# Patient Record
Sex: Male | Born: 1965
Health system: Southern US, Community
[De-identification: ages and names within clinical notes are randomized; demographics above are authoritative.]

## PROBLEM LIST (undated history)

## (undated) DIAGNOSIS — E119 Type 2 diabetes mellitus without complications: Secondary | ICD-10-CM

## (undated) DIAGNOSIS — E785 Hyperlipidemia, unspecified: Secondary | ICD-10-CM

## (undated) DIAGNOSIS — I10 Essential (primary) hypertension: Secondary | ICD-10-CM

## (undated) HISTORY — DX: Type 2 diabetes mellitus without complications: E11.9

## (undated) HISTORY — DX: Hyperlipidemia, unspecified: E78.5

## (undated) HISTORY — DX: Essential (primary) hypertension: I10

---

## 2003-02-10 ENCOUNTER — Emergency Department (HOSPITAL_COMMUNITY): Admission: EM | Admit: 2003-02-10 | Discharge: 2003-02-10 | Payer: Self-pay | Admitting: Emergency Medicine

## 2004-12-21 ENCOUNTER — Emergency Department (HOSPITAL_COMMUNITY): Admission: EM | Admit: 2004-12-21 | Discharge: 2004-12-21 | Payer: Self-pay | Admitting: Family Medicine

## 2006-10-27 ENCOUNTER — Emergency Department (HOSPITAL_COMMUNITY): Admission: EM | Admit: 2006-10-27 | Discharge: 2006-10-27 | Payer: Self-pay | Admitting: Family Medicine

## 2006-11-22 ENCOUNTER — Encounter: Admission: RE | Admit: 2006-11-22 | Discharge: 2006-11-22 | Payer: Self-pay | Admitting: Cardiology

## 2010-11-04 LAB — I-STAT 8, (EC8 V) (CONVERTED LAB)
Chloride: 104
Glucose, Bld: 142 — ABNORMAL HIGH
HCT: 51
Hemoglobin: 17.3 — ABNORMAL HIGH
Operator id: 116391
Sodium: 140
TCO2: 31
pH, Ven: 7.376 — ABNORMAL HIGH

## 2010-11-04 LAB — POCT URINALYSIS DIP (DEVICE)
Bilirubin Urine: NEGATIVE
Glucose, UA: NEGATIVE
Ketones, ur: NEGATIVE
Urobilinogen, UA: 0.2

## 2010-11-04 LAB — POCT I-STAT CREATININE: Creatinine, Ser: 1.1

## 2011-02-04 ENCOUNTER — Ambulatory Visit (INDEPENDENT_AMBULATORY_CARE_PROVIDER_SITE_OTHER): Payer: 59

## 2011-02-04 DIAGNOSIS — R05 Cough: Secondary | ICD-10-CM

## 2011-02-04 DIAGNOSIS — J069 Acute upper respiratory infection, unspecified: Secondary | ICD-10-CM

## 2011-02-04 DIAGNOSIS — R059 Cough, unspecified: Secondary | ICD-10-CM

## 2011-03-04 ENCOUNTER — Ambulatory Visit (INDEPENDENT_AMBULATORY_CARE_PROVIDER_SITE_OTHER): Payer: 59 | Admitting: Family Medicine

## 2011-03-04 ENCOUNTER — Ambulatory Visit: Payer: 59

## 2011-03-04 VITALS — BP 165/95 | HR 65 | Temp 98.5°F | Resp 18 | Ht 68.0 in | Wt 192.6 lb

## 2011-03-04 DIAGNOSIS — M25512 Pain in left shoulder: Secondary | ICD-10-CM

## 2011-03-04 DIAGNOSIS — I10 Essential (primary) hypertension: Secondary | ICD-10-CM

## 2011-03-04 DIAGNOSIS — M79609 Pain in unspecified limb: Secondary | ICD-10-CM

## 2011-03-04 LAB — COMPREHENSIVE METABOLIC PANEL
ALT: 36 U/L (ref 0–53)
AST: 24 U/L (ref 0–37)
Albumin: 4.3 g/dL (ref 3.5–5.2)
Alkaline Phosphatase: 97 U/L (ref 39–117)
BUN: 13 mg/dL (ref 6–23)
CO2: 26 mEq/L (ref 19–32)
Calcium: 9.3 mg/dL (ref 8.4–10.5)
Chloride: 103 mEq/L (ref 96–112)
Creat: 0.87 mg/dL (ref 0.50–1.35)
Glucose, Bld: 147 mg/dL — ABNORMAL HIGH (ref 70–99)
Potassium: 4 mEq/L (ref 3.5–5.3)
Sodium: 139 mEq/L (ref 135–145)
Total Bilirubin: 0.6 mg/dL (ref 0.3–1.2)
Total Protein: 7.6 g/dL (ref 6.0–8.3)

## 2011-03-04 LAB — POCT URINALYSIS DIPSTICK
Bilirubin, UA: NEGATIVE
Blood, UA: NEGATIVE
Glucose, UA: NEGATIVE
Ketones, UA: NEGATIVE
Leukocytes, UA: NEGATIVE
Nitrite, UA: NEGATIVE
Protein, UA: 30
Spec Grav, UA: 1.015
Urobilinogen, UA: 0.2
pH, UA: 7.5

## 2011-03-04 LAB — POCT CBC
Granulocyte percent: 55 %G (ref 37–80)
HCT, POC: 42.4 % — AB (ref 43.5–53.7)
Hemoglobin: 13.6 g/dL — AB (ref 14.1–18.1)
Lymph, poc: 2.4 (ref 0.6–3.4)
MCH, POC: 28.6 pg (ref 27–31.2)
MCHC: 32.1 g/dL (ref 31.8–35.4)
MCV: 89.2 fL (ref 80–97)
MID (cbc): 1.1 — AB (ref 0–0.9)
MPV: 9.7 fL (ref 0–99.8)
POC Granulocyte: 4.2 (ref 2–6.9)
POC LYMPH PERCENT: 31.2 %L (ref 10–50)
POC MID %: 13.8 %M — AB (ref 0–12)
Platelet Count, POC: 189 10*3/uL (ref 142–424)
RBC: 4.75 M/uL (ref 4.69–6.13)
RDW, POC: 13.6 %
WBC: 7.7 10*3/uL (ref 4.6–10.2)

## 2011-03-04 MED ORDER — MELOXICAM 7.5 MG PO TABS: 7.5000 mg | ORAL_TABLET | Freq: Every day | ORAL | Status: AC

## 2011-03-04 MED ORDER — AMLODIPINE BESYLATE 5 MG PO TABS: 5.0000 mg | ORAL_TABLET | Freq: Every day | ORAL | Status: DC

## 2011-03-04 NOTE — Progress Notes (Signed)
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  He comes in for BP check and pain in left upper arm for 2 days.  His BP was elevated in mid January.   NKI No prior arm pain. Left arm hurts from shoulder to the elbow, fluctuating, worse today No chest pain, SOB, nausea, fever, diaphoresis. Rx:  Tylenol-no help  O:  NAD HEENT:  Negative Skin: warm and dry Neck:  Supple, nontender, no adenop Axillae:  No adenop, nontender Arm:  Nl skin, ROM, muscle contour Chest:  Clear Heart:  Reg, no murmur Abdomen:  Soft, nontender Results for orders placed in visit on 03/04/11  POCT CBC      Component Value Range   WBC 7.7  4.6 - 10.2 (K/uL)   Lymph, poc 2.4  0.6 - 3.4    POC LYMPH PERCENT 31.2  10 - 50 (%L)   MID (cbc) 1.1 (*) 0 - 0.9    POC MID % 13.8 (*) 0 - 12 (%M)   POC Granulocyte 4.2  2 - 6.9    Granulocyte percent 55.0  37 - 80 (%G)   RBC 4.75  4.69 - 6.13 (M/uL)   Hemoglobin 13.6 (*) 14.1 - 18.1 (g/dL)   HCT, POC 21.3 (*) 08.6 - 53.7 (%)   MCV 89.2  80 - 97 (fL)   MCH, POC 28.6  27 - 31.2 (pg)   MCHC 32.1  31.8 - 35.4 (g/dL)   RDW, POC 57.8     Platelet Count, POC 189  142 - 424 (K/uL)   MPV 9.7  0 - 99.8 (fL)  POCT URINALYSIS DIPSTICK      Component Value Range   Color, UA yellow     Clarity, UA clear     Glucose, UA neg     Bilirubin, UA neg     Ketones, UA neg     Spec Grav, UA 1.015     Blood, UA neg     pH, UA 7.5     Protein, UA 30     Urobilinogen, UA 0.2     Nitrite, UA neg     Leukocytes, UA Negative    EKG:  [facility] reading (PRIMARY) by  Dr. Milus Glazier negative.   A:  Hypertension, Atypical left arm pain

## 2011-03-04 NOTE — Patient Instructions (Signed)
Return in 1 month for blood pressure check and review of the arm pain   Hypertension As your heart beats, it forces blood through your arteries. This force is your blood pressure. If the pressure is too high, it is called hypertension (HTN) or high blood pressure. HTN is dangerous because you may have it and not know it. High blood pressure may mean that your heart has to work harder to pump blood. Your arteries may be narrow or stiff. The extra work puts you at risk for heart disease, stroke, and other problems.  Blood pressure consists of two numbers, a higher number over a lower, 110/72, for example. It is stated as "110 over 72." The ideal is below 120 for the top number (systolic) and under 80 for the bottom (diastolic). Write down your blood pressure today. You should pay close attention to your blood pressure if you have certain conditions such as:  Heart failure.   Prior heart attack.   Diabetes   Chronic kidney disease.   Prior stroke.   Multiple risk factors for heart disease.  To see if you have HTN, your blood pressure should be measured while you are seated with your arm held at the level of the heart. It should be measured at least twice. A one-time elevated blood pressure reading (especially in the Emergency Department) does not mean that you need treatment. There may be conditions in which the blood pressure is different between your right and left arms. It is important to see your caregiver soon for a recheck. Most people have essential hypertension which means that there is not a specific cause. This type of high blood pressure may be lowered by changing lifestyle factors such as:  Stress.   Smoking.   Lack of exercise.   Excessive weight.   Drug/tobacco/alcohol use.   Eating less salt.  Most people do not have symptoms from high blood pressure until it has caused damage to the body. Effective treatment can often prevent, delay or reduce that damage. TREATMENT    When a cause has been identified, treatment for high blood pressure is directed at the cause. There are a large number of medications to treat HTN. These fall into several categories, and your caregiver will help you select the medicines that are best for you. Medications may have side effects. You should review side effects with your caregiver. If your blood pressure stays high after you have made lifestyle changes or started on medicines,   Your medication(s) may need to be changed.   Other problems may need to be addressed.   Be certain you understand your prescriptions, and know how and when to take your medicine.   Be sure to follow up with your caregiver within the time frame advised (usually within two weeks) to have your blood pressure rechecked and to review your medications.   If you are taking more than one medicine to lower your blood pressure, make sure you know how and at what times they should be taken. Taking two medicines at the same time can result in blood pressure that is too low.  SEEK IMMEDIATE MEDICAL CARE IF:  You develop a severe headache, blurred or changing vision, or confusion.   You have unusual weakness or numbness, or a faint feeling.   You have severe chest or abdominal pain, vomiting, or breathing problems.  MAKE SURE YOU:   Understand these instructions.   Will watch your condition.   Will get help right away if  you are not doing well or get worse.  Document Released: 01/10/2005 Document Revised: 09/22/2010 Document Reviewed: 08/31/2007 [facility] Patient Information 2012 [place], [place].

## 2012-03-07 ENCOUNTER — Ambulatory Visit (INDEPENDENT_AMBULATORY_CARE_PROVIDER_SITE_OTHER): Payer: 59 | Admitting: Family Medicine

## 2012-03-07 VITALS — BP 144/91 | HR 65 | Temp 98.3°F | Resp 18 | Wt 192.0 lb

## 2012-03-07 DIAGNOSIS — I10 Essential (primary) hypertension: Secondary | ICD-10-CM

## 2012-03-07 DIAGNOSIS — Z0130 Encounter for examination of blood pressure without abnormal findings: Secondary | ICD-10-CM

## 2012-03-07 LAB — POCT CBC
Hemoglobin: 13.6 g/dL — AB (ref 14.1–18.1)
Lymph, poc: 2.3 (ref 0.6–3.4)
MCH, POC: 28.6 pg (ref 27–31.2)
MCHC: 31.6 g/dL — AB (ref 31.8–35.4)
POC Granulocyte: 3.4 (ref 2–6.9)
POC MID %: 12.1 %M — AB (ref 0–12)
Platelet Count, POC: 209 10*3/uL (ref 142–424)
WBC: 6.4 10*3/uL (ref 4.6–10.2)

## 2012-03-07 LAB — POCT URINALYSIS DIPSTICK
Blood, UA: NEGATIVE
Spec Grav, UA: 1.025
Urobilinogen, UA: 0.2

## 2012-03-07 MED ORDER — AMLODIPINE BESYLATE 10 MG PO TABS: 10.0000 mg | ORAL_TABLET | Freq: Every day | ORAL | Status: DC

## 2012-03-07 MED ORDER — METFORMIN HCL 500 MG PO TABS: 500.0000 mg | ORAL_TABLET | Freq: Two times a day (BID) | ORAL | Status: DC

## 2012-03-07 NOTE — Patient Instructions (Addendum)
1. Blood pressure check  POCT CBC   POCT CBC   POCT urinalysis dipstick   POCT glycosylated hemoglobin (Hb A1C)   TSH   Comprehensive metabolic panel   Lipid panel  2. Essential hypertension, benign  amLODipine (NORVASC) 10 MG tablet   amLODipine (NORVASC) 10 MG tablet  3. Type II or unspecified type diabetes mellitus without mention of complication, uncontrolled  metFORMIN (GLUCOPHAGE) 500 MG tablet   metFORMIN (GLUCOPHAGE) 500 MG tablet

## 2012-03-07 NOTE — Progress Notes (Signed)
[address]   [place], [place]  [address]   [phone]  Subjective:    Patient ID: Chad Jacobs, male    DOB: 1966-01-20, 47 y.o.   MRN: [id number]  HPI This 47 y.o. male presents for evaluation of HTN.  Last visit one year ago.  Started on Amlodipine 5mg  daily.  Checks BP at pharmacy intermittently; no recent readings.  No chest pain, palpitations, shortness of breath, leg swelling.  Heat in feet.  Intermittent HA; no dizziness; no blurred vision.  Reports good compliance with medication; good tolerance to medication; good symptom control.   2.  DM II: Blood sugar of 146 at visit last year; did not return for repeat labs.  B:  eggs, juice or water.  Snack: none  Lunch:  Beef, chicken, rice, water.  Snack:  Chips.  Supper: beef, chicken, vegetables, water, apples. Has gained weight in stomach.  Feels well. No family history of DMII.   Review of Systems  Constitutional: Negative for fever, chills, diaphoresis and fatigue.  Respiratory: Negative for shortness of breath and wheezing.   Cardiovascular: Negative for chest pain, palpitations and leg swelling.  Neurological: Negative for dizziness, tremors, seizures, syncope, facial asymmetry, speech difficulty, weakness, light-headedness, numbness and headaches.        Past Medical History  Diagnosis Date  . Hypertension     History reviewed. No pertinent past surgical history.  Prior to Admission medications   Medication Sig Start Date End Date Taking? Authorizing Provider  amLODipine (NORVASC) 5 MG tablet Take 1 tablet (5 mg total) by mouth daily. 03/04/11 03/03/12  Elvina Sidle, MD    No Known Allergies  History   Social History  . Marital Status: Married    Spouse Name: N/A    Number of Children: N/A  . Years of Education: N/A   Occupational History  . Not on file.   Social History Main Topics  . Smoking status: Never Smoker   . Smokeless tobacco: Never Used  . Alcohol Use: No  . Drug Use: No  . Sexually Active: Not  on file   Other Topics Concern  . Not on file   Social History Narrative   Marital status: single; not dating.  Moved to [place] from [place] in 2004.      Children:  2 children; no grandchildren.      Lives: with brother.      Employment: works for [employer]; Naval architect work; [employer] on trucks.      Tobacco: none      Alcohol: none      Drugs: none          No family history on file.  Objective:   Physical Exam  Nursing note and vitals reviewed. Constitutional: He is oriented to person, place, and time. He appears well-developed and well-nourished. No distress.  HENT:  Mouth/Throat: Oropharynx is clear and moist.  Poor dentition.  Eyes: Conjunctivae and EOM are normal. Pupils are equal, round, and reactive to light.  Neck: Normal range of motion. Neck supple. No JVD present. No thyromegaly present.  Cardiovascular: Normal rate, regular rhythm and normal heart sounds.  Exam reveals no gallop and no friction rub.   No murmur heard. Pulmonary/Chest: Effort normal and breath sounds normal. No respiratory distress. He has no wheezes. He has no rales.  Abdominal: Soft. Bowel sounds are normal. He exhibits no distension. There is no tenderness. There is no rebound and no guarding.  Lymphadenopathy:    He has no  cervical adenopathy.  Neurological: He is alert and oriented to person, place, and time. No cranial nerve deficit. He exhibits normal muscle tone. Coordination normal.  Skin: He is not diaphoretic.  Psychiatric: He has a normal mood and affect. His behavior is normal.   Results for orders placed in visit on 03/07/12  POCT CBC      Result Value Range   WBC 6.4  4.6 - 10.2 K/uL   Lymph, poc 2.3  0.6 - 3.4   POC LYMPH PERCENT 35.4  10 - 50 %L   MID (cbc) 0.8  0 - 0.9   POC MID % 12.1 (*) 0 - 12 %M   POC Granulocyte 3.4  2 - 6.9   Granulocyte percent 52.5  37 - 80 %G   RBC 4.75  4.69 - 6.13 M/uL   Hemoglobin 13.6 (*) 14.1 - 18.1 g/dL   HCT, POC 16.1 (*) 09.6 - 53.7 %    MCV 90.8  80 - 97 fL   MCH, POC 28.6  27 - 31.2 pg   MCHC 31.6 (*) 31.8 - 35.4 g/dL   RDW, POC 04.5     Platelet Count, POC 209  142 - 424 K/uL   MPV 9.6  0 - 99.8 fL  POCT URINALYSIS DIPSTICK      Result Value Range   Color, UA yellow     Clarity, UA clear     Glucose, UA neg     Bilirubin, UA neg     Ketones, UA neg     Spec Grav, UA 1.025     Blood, UA neg     pH, UA 6.0     Protein, UA trace     Urobilinogen, UA 0.2     Nitrite, UA neg     Leukocytes, UA Negative    POCT GLYCOSYLATED HEMOGLOBIN (HGB A1C)      Result Value Range   Hemoglobin A1C 7.4         Assessment & Plan:  Blood pressure check - Plan: POCT CBC, POCT urinalysis dipstick, POCT glycosylated hemoglobin (Hb A1C), TSH, Comprehensive metabolic panel, Lipid panel  Essential hypertension, benign - Plan: amLODipine (NORVASC) 10 MG tablet  Type II or unspecified type diabetes mellitus without mention of complication, uncontrolled - Plan: metFORMIN (GLUCOPHAGE) 500 MG tablet, Ambulatory referral to diabetic education    1. HTN:  Improved; increase Amlodipine to 10mg  daily.  Will warrant ACE in future with new diagnosis of DMII but hesitate to add two new medications at visit. Obtain labs. 2.  DMII: new diagnosis.  Counseled extensively during visit; recommend weight loss, exercise, low-sugar and low-carbohydrate food intake.  Rx for Metformin provided to start bid.  Refer for diabetic education.    Meds ordered this encounter  Medications  . amLODipine (NORVASC) 10 MG tablet    Sig: Take 1 tablet (10 mg total) by mouth daily.    Dispense:  90 tablet    Refill:  1  . metFORMIN (GLUCOPHAGE) 500 MG tablet    Sig: Take 1 tablet (500 mg total) by mouth 2 (two) times daily with a meal.    Dispense:  60 tablet    Refill:  5

## 2012-03-09 LAB — LIPID PANEL
Cholesterol: 228 mg/dL — ABNORMAL HIGH (ref 0–200)
LDL Cholesterol: 155 mg/dL — ABNORMAL HIGH (ref 0–99)
Total CHOL/HDL Ratio: 5.2 Ratio
VLDL: 29 mg/dL (ref 0–40)

## 2012-03-09 LAB — COMPREHENSIVE METABOLIC PANEL
AST: 21 U/L (ref 0–37)
BUN: 17 mg/dL (ref 6–23)
CO2: 27 mEq/L (ref 19–32)
Calcium: 8.9 mg/dL (ref 8.4–10.5)
Glucose, Bld: 173 mg/dL — ABNORMAL HIGH (ref 70–99)
Potassium: 4 mEq/L (ref 3.5–5.3)

## 2012-03-09 LAB — TSH: TSH: 1.031 u[IU]/mL (ref 0.350–4.500)

## 2012-03-13 NOTE — Progress Notes (Signed)
Called pt to schedule 3 month follow up with Dr. Katrinka Blazing, only # given = no voicemail has been set up.

## 2012-03-14 NOTE — Progress Notes (Signed)
Reminder letter sent to pt to schedule 3 month f-up with Dr. Katrinka Blazing.

## 2012-06-04 ENCOUNTER — Encounter: Payer: Self-pay | Admitting: Family Medicine

## 2012-06-04 ENCOUNTER — Ambulatory Visit (INDEPENDENT_AMBULATORY_CARE_PROVIDER_SITE_OTHER): Payer: 59 | Admitting: Family Medicine

## 2012-06-04 VITALS — BP 142/98 | HR 73 | Temp 98.9°F | Resp 16 | Ht 68.0 in | Wt 189.2 lb

## 2012-06-04 DIAGNOSIS — I10 Essential (primary) hypertension: Secondary | ICD-10-CM

## 2012-06-04 DIAGNOSIS — E119 Type 2 diabetes mellitus without complications: Secondary | ICD-10-CM

## 2012-06-04 DIAGNOSIS — E7800 Pure hypercholesterolemia, unspecified: Secondary | ICD-10-CM

## 2012-06-04 DIAGNOSIS — IMO0001 Reserved for inherently not codable concepts without codable children: Secondary | ICD-10-CM

## 2012-06-04 LAB — CBC WITH DIFFERENTIAL/PLATELET
Basophils Relative: 0 % (ref 0–1)
Eosinophils Absolute: 0.9 10*3/uL — ABNORMAL HIGH (ref 0.0–0.7)
Lymphocytes Relative: 24 % (ref 12–46)
Lymphs Abs: 1.9 10*3/uL (ref 0.7–4.0)
MCHC: 34 g/dL (ref 30.0–36.0)
MCV: 86.7 fL (ref 78.0–100.0)
RDW: 13.6 % (ref 11.5–15.5)
WBC: 7.8 10*3/uL (ref 4.0–10.5)

## 2012-06-04 LAB — COMPREHENSIVE METABOLIC PANEL
Albumin: 4.1 g/dL (ref 3.5–5.2)
Alkaline Phosphatase: 87 U/L (ref 39–117)
BUN: 21 mg/dL (ref 6–23)
CO2: 20 mEq/L (ref 19–32)
Chloride: 105 mEq/L (ref 96–112)
Creat: 0.87 mg/dL (ref 0.50–1.35)
Potassium: 4.1 mEq/L (ref 3.5–5.3)

## 2012-06-04 LAB — POCT GLYCOSYLATED HEMOGLOBIN (HGB A1C): Hemoglobin A1C: 6.9

## 2012-06-04 MED ORDER — METFORMIN HCL 500 MG PO TABS: 500.0000 mg | ORAL_TABLET | Freq: Two times a day (BID) | ORAL | Status: DC

## 2012-06-04 MED ORDER — AMLODIPINE BESYLATE 10 MG PO TABS: 10.0000 mg | ORAL_TABLET | Freq: Every day | ORAL | Status: DC

## 2012-06-04 NOTE — Progress Notes (Signed)
[address]   [place], [place]  [address]   [phone]  Subjective:    Patient ID: Chad Jacobs, male    DOB: 1965/04/11, 47 y.o.   MRN: [id number]  HPI This 47 y.o. male presents for three month follow-up:  1. HTN: increased Amlodipine to 10mg  daily at last visit; reports compliance with medication; good tolerance to medication; good symptom control.  Denies chest pain, palpitations, SOB, leg swelling. Denies HA, dizziness, focal weakness, paresthesias.  2.  DMII: new diagnosis at last visit; did not go for diabetic nutrition education; not checking sugars; reports compliance with medication/Metformin; no side effects.    3. Hyperlipidemia:  Trying to lose weight.     Review of Systems  Constitutional: Negative for fever, chills, diaphoresis and fatigue.  Respiratory: Negative for shortness of breath, wheezing and stridor.   Cardiovascular: Negative for chest pain, palpitations and leg swelling.  Endocrine: Negative for cold intolerance, heat intolerance, polydipsia, polyphagia and polyuria.  Skin: Negative for color change, pallor, rash and wound.  Neurological: Negative for dizziness, tremors, seizures, syncope, facial asymmetry, speech difficulty, weakness, light-headedness, numbness and headaches.    Past Medical History  Diagnosis Date  . Hypertension     History reviewed. No pertinent past surgical history.  Prior to Admission medications   Medication Sig Start Date End Date Taking? Authorizing Provider  amLODipine (NORVASC) 10 MG tablet Take 1 tablet (10 mg total) by mouth daily. 06/04/12  Yes Ethelda Chick, MD  metFORMIN (GLUCOPHAGE) 500 MG tablet Take 1 tablet (500 mg total) by mouth 2 (two) times daily with a meal. 06/04/12  Yes Ethelda Chick, MD    No Known Allergies  History   Social History  . Marital Status: Married    Spouse Name: N/A    Number of Children: N/A  . Years of Education: N/A   Occupational History  . Not on file.   Social History Main  Topics  . Smoking status: Never Smoker   . Smokeless tobacco: Never Used  . Alcohol Use: No  . Drug Use: No  . Sexually Active: Not on file   Other Topics Concern  . Not on file   Social History Narrative   Marital status: single; not dating.  Moved to [place] from [place] in 2004.      Children:  2 children; no grandchildren.      Lives: with brother.      Employment: works for [employer]; Naval architect work; [employer] on trucks.      Tobacco: none      Alcohol: none      Drugs: none          History reviewed. No pertinent family history.     Objective:   Physical Exam  Nursing note and vitals reviewed. Constitutional: He is oriented to person, place, and time. He appears well-developed and well-nourished. No distress.  HENT:  Head: Normocephalic and atraumatic.  Mouth/Throat: Oropharynx is clear and moist.  Eyes: Conjunctivae and EOM are normal. Pupils are equal, round, and reactive to light.  Neck: Normal range of motion. Neck supple. No JVD present. No thyromegaly present.  Cardiovascular: Normal rate, regular rhythm, normal heart sounds and intact distal pulses.  Exam reveals no gallop and no friction rub.   No murmur heard. Pulmonary/Chest: Effort normal and breath sounds normal. He has no wheezes. He has no rales.  Abdominal: Soft. Bowel sounds are normal. There is no tenderness. There is no rebound and no guarding.  Lymphadenopathy:    He has no cervical adenopathy.  Neurological: He is alert and oriented to person, place, and time. No cranial nerve deficit. He exhibits normal muscle tone. Coordination normal.  Skin: Skin is warm and dry. No rash noted. He is not diaphoretic.  Psychiatric: He has a normal mood and affect. His behavior is normal.        Assessment & Plan:  Type II or unspecified type diabetes mellitus without mention of complication, not stated as uncontrolled - Plan: CBC with Differential, POCT glycosylated hemoglobin (Hb A1C), Comprehensive metabolic  panel  Essential hypertension, benign - Plan: Comprehensive metabolic panel, amLODipine (NORVASC) 10 MG tablet, DISCONTINUED: amLODipine (NORVASC) 10 MG tablet, DISCONTINUED: amLODipine (NORVASC) 10 MG tablet  Type II or unspecified type diabetes mellitus without mention of complication, uncontrolled - Plan: metFORMIN (GLUCOPHAGE) 500 MG tablet, DISCONTINUED: metFORMIN (GLUCOPHAGE) 500 MG tablet, DISCONTINUED: metFORMIN (GLUCOPHAGE) 500 MG tablet   1. HTN: improved; continue Amlodipine 10mg  daily.  Obtain labs. 2.  DMII: stable; non-compliance with nutrition consultation; continue Metformin 500mg  bid; counseling regarding low-sugar and low-carbohydrate food choices reviewed during visit again. 3. Hyperlipidemia: New.  Recommend weight loss, exercise, low-cholesterol and low-fat food choices.   Meds ordered this encounter  Medications  . DISCONTD: metFORMIN (GLUCOPHAGE) 500 MG tablet    Sig: Take 1 tablet (500 mg total) by mouth 2 (two) times daily with a meal.    Dispense:  180 tablet    Refill:  1  . DISCONTD: amLODipine (NORVASC) 10 MG tablet    Sig: Take 1 tablet (10 mg total) by mouth daily.    Dispense:  90 tablet    Refill:  1  . DISCONTD: metFORMIN (GLUCOPHAGE) 500 MG tablet    Sig: Take 1 tablet (500 mg total) by mouth 2 (two) times daily with a meal.    Dispense:  180 tablet    Refill:  1  . DISCONTD: amLODipine (NORVASC) 10 MG tablet    Sig: Take 1 tablet (10 mg total) by mouth daily.    Dispense:  90 tablet    Refill:  1  . metFORMIN (GLUCOPHAGE) 500 MG tablet    Sig: Take 1 tablet (500 mg total) by mouth 2 (two) times daily with a meal.    Dispense:  180 tablet    Refill:  1  . amLODipine (NORVASC) 10 MG tablet    Sig: Take 1 tablet (10 mg total) by mouth daily.    Dispense:  90 tablet    Refill:  1

## 2012-10-20 ENCOUNTER — Ambulatory Visit (INDEPENDENT_AMBULATORY_CARE_PROVIDER_SITE_OTHER): Payer: 59 | Admitting: Family Medicine

## 2012-10-20 VITALS — BP 130/90 | HR 53 | Temp 98.8°F | Resp 16 | Ht 69.5 in | Wt 186.4 lb

## 2012-10-20 DIAGNOSIS — M79602 Pain in left arm: Secondary | ICD-10-CM

## 2012-10-20 DIAGNOSIS — I10 Essential (primary) hypertension: Secondary | ICD-10-CM

## 2012-10-20 DIAGNOSIS — M79609 Pain in unspecified limb: Secondary | ICD-10-CM

## 2012-10-20 DIAGNOSIS — E119 Type 2 diabetes mellitus without complications: Secondary | ICD-10-CM

## 2012-10-20 DIAGNOSIS — IMO0001 Reserved for inherently not codable concepts without codable children: Secondary | ICD-10-CM

## 2012-10-20 LAB — COMPREHENSIVE METABOLIC PANEL
ALT: 21 U/L (ref 0–53)
AST: 16 U/L (ref 0–37)
Albumin: 4 g/dL (ref 3.5–5.2)
Alkaline Phosphatase: 99 U/L (ref 39–117)
BUN: 16 mg/dL (ref 6–23)
CO2: 27 mEq/L (ref 19–32)
Calcium: 9.2 mg/dL (ref 8.4–10.5)
Chloride: 103 mEq/L (ref 96–112)
Creat: 0.9 mg/dL (ref 0.50–1.35)
Glucose, Bld: 133 mg/dL — ABNORMAL HIGH (ref 70–99)
Potassium: 3.9 mEq/L (ref 3.5–5.3)
Sodium: 135 mEq/L (ref 135–145)
Total Bilirubin: 0.3 mg/dL (ref 0.3–1.2)
Total Protein: 7.3 g/dL (ref 6.0–8.3)

## 2012-10-20 LAB — MICROALBUMIN, URINE: Microalb, Ur: 0.5 mg/dL (ref 0.00–1.89)

## 2012-10-20 LAB — POCT CBC
Granulocyte percent: 60 %G (ref 37–80)
HCT, POC: 41.9 % — AB (ref 43.5–53.7)
Hemoglobin: 13.2 g/dL — AB (ref 14.1–18.1)
Lymph, poc: 2.2 (ref 0.6–3.4)
MCH, POC: 29.4 pg (ref 27–31.2)
MCHC: 31.5 g/dL — AB (ref 31.8–35.4)
MCV: 93.4 fL (ref 80–97)
MID (cbc): 0.7 (ref 0–0.9)
MPV: 9.4 fL (ref 0–99.8)
POC Granulocyte: 4.3 (ref 2–6.9)
POC LYMPH PERCENT: 30.6 %L (ref 10–50)
POC MID %: 9.4 %M (ref 0–12)
Platelet Count, POC: 196 10*3/uL (ref 142–424)
RBC: 4.49 M/uL — AB (ref 4.69–6.13)
RDW, POC: 13.5 %
WBC: 7.1 10*3/uL (ref 4.6–10.2)

## 2012-10-20 LAB — GLUCOSE, POCT (MANUAL RESULT ENTRY): POC Glucose: 134 mg/dl — AB (ref 70–99)

## 2012-10-20 LAB — POCT GLYCOSYLATED HEMOGLOBIN (HGB A1C): Hemoglobin A1C: 6.4

## 2012-10-20 MED ORDER — MELOXICAM 7.5 MG PO TABS: 7.5000 mg | ORAL_TABLET | Freq: Every day | ORAL | Status: DC

## 2012-10-20 MED ORDER — LISINOPRIL 40 MG PO TABS: 40.0000 mg | ORAL_TABLET | Freq: Every day | ORAL | Status: DC

## 2012-10-20 MED ORDER — METFORMIN HCL 500 MG PO TABS: ORAL_TABLET | ORAL | Status: DC

## 2012-10-20 NOTE — Progress Notes (Signed)
47 year old African American with persistent left arm pain. He was doing better was taking meloxicam but that ran out. He attributes the pain to his new dose of amlodipine.  Objective: Appearance of the left arm is normal with good range of motion. He's obviously very athletic.  Results for orders placed in visit on 10/20/12  POCT GLYCOSYLATED HEMOGLOBIN (HGB A1C)      Result Value Range   Hemoglobin A1C 6.4    POCT CBC      Result Value Range   WBC 7.1  4.6 - 10.2 K/uL   Lymph, poc 2.2  0.6 - 3.4   POC LYMPH PERCENT 30.6  10 - 50 %L   MID (cbc) 0.7  0 - 0.9   POC MID % 9.4  0 - 12 %M   POC Granulocyte 4.3  2 - 6.9   Granulocyte percent 60.0  37 - 80 %G   RBC 4.49 (*) 4.69 - 6.13 M/uL   Hemoglobin 13.2 (*) 14.1 - 18.1 g/dL   HCT, POC 16.1 (*) 09.6 - 53.7 %   MCV 93.4  80 - 97 fL   MCH, POC 29.4  27 - 31.2 pg   MCHC 31.5 (*) 31.8 - 35.4 g/dL   RDW, POC 04.5     Platelet Count, POC 196  142 - 424 K/uL   MPV 9.4  0 - 99.8 fL  GLUCOSE, POCT (MANUAL RESULT ENTRY)      Result Value Range   POC Glucose 134 (*) 70 - 99 mg/dl   Diabetes - Plan: POCT glycosylated hemoglobin (Hb A1C), POCT CBC, POCT glucose (manual entry), Microalbumin, urine, Comprehensive metabolic panel, metFORMIN (GLUCOPHAGE) 500 MG tablet  Hypertension - Plan: POCT glycosylated hemoglobin (Hb A1C), POCT CBC, POCT glucose (manual entry), Microalbumin, urine, Comprehensive metabolic panel, lisinopril (PRINIVIL,ZESTRIL) 40 MG tablet  Left arm pain - Plan: meloxicam (MOBIC) 7.5 MG tablet  Type II or unspecified type diabetes mellitus without mention of complication, uncontrolled - Plan: metFORMIN (GLUCOPHAGE) 500 MG tablet  Signed, Elvina Sidle, MD

## 2013-02-19 ENCOUNTER — Ambulatory Visit (INDEPENDENT_AMBULATORY_CARE_PROVIDER_SITE_OTHER): Payer: 59 | Admitting: Emergency Medicine

## 2013-02-19 VITALS — BP 124/78 | HR 72 | Temp 98.8°F | Resp 17 | Ht 68.5 in | Wt 187.0 lb

## 2013-02-19 DIAGNOSIS — IMO0001 Reserved for inherently not codable concepts without codable children: Secondary | ICD-10-CM

## 2013-02-19 DIAGNOSIS — E119 Type 2 diabetes mellitus without complications: Secondary | ICD-10-CM

## 2013-02-19 DIAGNOSIS — E1165 Type 2 diabetes mellitus with hyperglycemia: Secondary | ICD-10-CM

## 2013-02-19 DIAGNOSIS — M79602 Pain in left arm: Secondary | ICD-10-CM

## 2013-02-19 DIAGNOSIS — I10 Essential (primary) hypertension: Secondary | ICD-10-CM

## 2013-02-19 LAB — POCT GLYCOSYLATED HEMOGLOBIN (HGB A1C): Hemoglobin A1C: 6.9

## 2013-02-19 MED ORDER — METFORMIN HCL 500 MG PO TABS: ORAL_TABLET | ORAL | Status: DC

## 2013-02-19 MED ORDER — MELOXICAM 15 MG PO TABS: 7.5000 mg | ORAL_TABLET | Freq: Every day | ORAL | Status: DC

## 2013-02-19 MED ORDER — AMLODIPINE BESYLATE 10 MG PO TABS: 10.0000 mg | ORAL_TABLET | Freq: Every day | ORAL | Status: DC

## 2013-02-19 MED ORDER — LISINOPRIL 40 MG PO TABS: 40.0000 mg | ORAL_TABLET | Freq: Every day | ORAL | Status: DC

## 2013-02-19 NOTE — Patient Instructions (Signed)
Temporomandibular Problems  Temporomandibular joint (TMJ) dysfunction means there are problems with the joint between your jaw and your skull. This is a joint lined by cartilage like other joints in your body but also has a small disc in the joint which keeps the bones from rubbing on each other. These joints are like other joints and can get inflamed (sore) from arthritis and other problems. When this joint gets sore, it can cause headaches and pain in the jaw and the face. CAUSES  Usually the arthritic types of problems are caused by soreness in the joint. Soreness in the joint can also be caused by overuse. This may come from grinding your teeth. It may also come from mis-alignment in the joint. DIAGNOSIS Diagnosis of this condition can often be made by history and exam. Sometimes your caregiver may need X-rays or an MRI scan to determine the exact cause. It may be necessary to see your dentist to determine if your teeth and jaws are lined up correctly. TREATMENT  Most of the time this problem is not serious; however, sometimes it can persist (become chronic). When this happens medications that will cut down on inflammation (soreness) help. Sometimes a shot of cortisone into the joint will be helpful. If your teeth are not aligned it may help for your dentist to make a splint for your mouth that can help this problem. If no physical problems can be found, the problem may come from tension. If tension is found to be the cause, biofeedback or relaxation techniques may be helpful. HOME CARE INSTRUCTIONS   Later in the day, applications of ice packs may be helpful. Ice can be used in a plastic bag with a towel around it to prevent frostbite to skin. This may be used about every 2 hours for 20 to 30 minutes, as needed while awake, or as directed by your caregiver.  Only take over-the-counter or prescription medicines for pain, discomfort, or fever as directed by your caregiver.  If physical therapy was  prescribed, follow your caregiver's directions.  Wear mouth appliances as directed if they were given. Document Released: 10/05/2000 Document Revised: 04/04/2011 Document Reviewed: 01/13/2008 [employer] Patient Information 2014 [employer].  

## 2013-02-19 NOTE — Progress Notes (Signed)
[facility] [address], [place] [address] [phone]  Date:  02/19/2013   Name:  Chad Jacobs   DOB:  09/15/1965   MRN:  [id number]  PCP:  No primary provider on file.    Chief Complaint: Hypertension and Diabetes   History of Present Illness:  Chad Jacobs is a 48 y.o. very pleasant male patient who presents with the following:  History of NIDDM and hypertension. Says that he has "lots of medication" and has not returned for follow up labs.  Now has pain in his teeth that he claims is caused by his medication.  denies swollen gums but has thermal sensitivity.  No improvement with over the counter medications or other home remedies. Denies other complaint or health concern today.   Patient Active Problem List   Diagnosis Date Noted  . Hypertension 03/04/2011    Past Medical History  Diagnosis Date  . Hypertension     No past surgical history on file.  History  Substance Use Topics  . Smoking status: Never Smoker   . Smokeless tobacco: Never Used  . Alcohol Use: No    No family history on file.  No Known Allergies  Medication list has been reviewed and updated.  Current Outpatient Prescriptions on File Prior to Visit  Medication Sig Dispense Refill  . amLODipine (NORVASC) 10 MG tablet Take 1 tablet (10 mg total) by mouth daily.  90 tablet  1  . lisinopril (PRINIVIL,ZESTRIL) 40 MG tablet Take 1 tablet (40 mg total) by mouth daily.  90 tablet  3  . meloxicam (MOBIC) 7.5 MG tablet Take 1 tablet (7.5 mg total) by mouth daily.  30 tablet  5  . metFORMIN (GLUCOPHAGE) 500 MG tablet One qhs  90 tablet  1   No current facility-administered medications on file prior to visit.    Review of Systems:  As per HPI, otherwise negative.    Physical Examination: Filed Vitals:   02/19/13 1259  BP: 124/78  Pulse: 72  Temp: 98.8 F (37.1 C)  Resp: 17   Filed Vitals:   02/19/13 1259  Height: 5' 8.5" (1.74 m)  Weight: 187 lb (84.823 kg)    Body mass index is 28.02 kg/(m^2). Ideal Body Weight: Weight in (lb) to have BMI = 25: 166.5  GEN: WDWN, NAD, Non-toxic, A & O x 3 HEENT: Atraumatic, Normocephalic. Neck supple. No masses, No LAD.  Fetid mouth odor.  Dentition and occlusion normal.  Tender right TMJ  Ears and Nose: No external deformity. CV: RRR, No M/G/R. No JVD. No thrill. No extra heart sounds. PULM: CTA B, no wheezes, crackles, rhonchi. No retractions. No resp. distress. No accessory muscle use. ABD: S, NT, ND, +BS. No rebound. No HSM. EXTR: No c/c/e NEURO Normal gait.  PSYCH: Normally interactive. Conversant. Not depressed or anxious appearing.  Calm demeanor.     Assessment and Plan: NIDDM TMJ dysfunction Right HBP   Signed,  Phillips OdorJeffery Coline Calkin, MD   Results for orders placed in visit on 02/19/13  POCT GLYCOSYLATED HEMOGLOBIN (HGB A1C)      Result Value Range   Hemoglobin A1C 6.9

## 2013-04-29 ENCOUNTER — Encounter: Payer: Self-pay | Admitting: Emergency Medicine

## 2013-09-28 ENCOUNTER — Ambulatory Visit (INDEPENDENT_AMBULATORY_CARE_PROVIDER_SITE_OTHER): Payer: 59 | Admitting: Emergency Medicine

## 2013-09-28 VITALS — BP 114/70 | HR 61 | Temp 98.3°F | Resp 16 | Ht 68.25 in | Wt 191.2 lb

## 2013-09-28 DIAGNOSIS — Z23 Encounter for immunization: Secondary | ICD-10-CM

## 2013-09-28 DIAGNOSIS — IMO0001 Reserved for inherently not codable concepts without codable children: Secondary | ICD-10-CM

## 2013-09-28 DIAGNOSIS — I10 Essential (primary) hypertension: Secondary | ICD-10-CM

## 2013-09-28 DIAGNOSIS — E109 Type 1 diabetes mellitus without complications: Secondary | ICD-10-CM

## 2013-09-28 DIAGNOSIS — E089 Diabetes mellitus due to underlying condition without complications: Secondary | ICD-10-CM

## 2013-09-28 DIAGNOSIS — R609 Edema, unspecified: Secondary | ICD-10-CM

## 2013-09-28 DIAGNOSIS — E119 Type 2 diabetes mellitus without complications: Secondary | ICD-10-CM | POA: Insufficient documentation

## 2013-09-28 DIAGNOSIS — E1165 Type 2 diabetes mellitus with hyperglycemia: Secondary | ICD-10-CM

## 2013-09-28 DIAGNOSIS — R109 Unspecified abdominal pain: Secondary | ICD-10-CM

## 2013-09-28 DIAGNOSIS — E139 Other specified diabetes mellitus without complications: Secondary | ICD-10-CM

## 2013-09-28 LAB — COMPREHENSIVE METABOLIC PANEL
ALBUMIN: 4.2 g/dL (ref 3.5–5.2)
ALK PHOS: 83 U/L (ref 39–117)
ALT: 30 U/L (ref 0–53)
AST: 22 U/L (ref 0–37)
BUN: 17 mg/dL (ref 6–23)
CHLORIDE: 105 meq/L (ref 96–112)
CO2: 29 mEq/L (ref 19–32)
Calcium: 9.4 mg/dL (ref 8.4–10.5)
Creat: 1.06 mg/dL (ref 0.50–1.35)
Glucose, Bld: 139 mg/dL — ABNORMAL HIGH (ref 70–99)
POTASSIUM: 4.4 meq/L (ref 3.5–5.3)
Sodium: 139 mEq/L (ref 135–145)
Total Bilirubin: 0.4 mg/dL (ref 0.2–1.2)
Total Protein: 7.5 g/dL (ref 6.0–8.3)

## 2013-09-28 LAB — POCT CBC
GRANULOCYTE PERCENT: 59 % (ref 37–80)
HCT, POC: 41.6 % — AB (ref 43.5–53.7)
HEMOGLOBIN: 13.4 g/dL — AB (ref 14.1–18.1)
LYMPH, POC: 2.5 (ref 0.6–3.4)
MCH: 29 pg (ref 27–31.2)
MCHC: 32.1 g/dL (ref 31.8–35.4)
MCV: 90.2 fL (ref 80–97)
MID (CBC): 0.4 (ref 0–0.9)
MPV: 8.9 fL (ref 0–99.8)
PLATELET COUNT, POC: 172 10*3/uL (ref 142–424)
POC Granulocyte: 4.1 (ref 2–6.9)
POC LYMPH PERCENT: 35.6 %L (ref 10–50)
POC MID %: 5.4 % (ref 0–12)
RBC: 4.61 M/uL — AB (ref 4.69–6.13)
RDW, POC: 13.8 %
WBC: 6.9 10*3/uL (ref 4.6–10.2)

## 2013-09-28 LAB — POCT URINALYSIS DIPSTICK
Bilirubin, UA: NEGATIVE
Blood, UA: NEGATIVE
GLUCOSE UA: NEGATIVE
Ketones, UA: NEGATIVE
LEUKOCYTES UA: NEGATIVE
NITRITE UA: NEGATIVE
Protein, UA: NEGATIVE
Spec Grav, UA: 1.015
UROBILINOGEN UA: 0.2
pH, UA: 6

## 2013-09-28 LAB — POCT UA - MICROSCOPIC ONLY
Casts, Ur, LPF, POC: NEGATIVE
Crystals, Ur, HPF, POC: NEGATIVE
Mucus, UA: NEGATIVE
YEAST UA: NEGATIVE

## 2013-09-28 LAB — POCT GLYCOSYLATED HEMOGLOBIN (HGB A1C): Hemoglobin A1C: 6.6

## 2013-09-28 LAB — GLUCOSE, POCT (MANUAL RESULT ENTRY): POC Glucose: 134 mg/dl — AB (ref 70–99)

## 2013-09-28 MED ORDER — METFORMIN HCL 500 MG PO TABS: ORAL_TABLET | ORAL | Status: DC

## 2013-09-28 MED ORDER — LISINOPRIL 40 MG PO TABS: 40.0000 mg | ORAL_TABLET | Freq: Every day | ORAL | Status: DC

## 2013-09-28 MED ORDER — AMLODIPINE BESYLATE 10 MG PO TABS: 10.0000 mg | ORAL_TABLET | Freq: Every day | ORAL | Status: DC

## 2013-09-28 NOTE — Patient Instructions (Signed)
Edema Edema is an abnormal buildup of fluids in your bodytissues. Edema is somewhatdependent on gravity to pull the fluid to the lowest place in your body. That makes the condition more common in the legs and thighs (lower extremities). Painless swelling of the feet and ankles is common and becomes more likely as you get older. It is also common in looser tissues, like around your eyes.  When the affected area is squeezed, the fluid may move out of that spot and leave a dent for a few moments. This dent is called pitting.  CAUSES  There are many possible causes of edema. Eating too much salt and being on your feet or sitting for a long time can cause edema in your legs and ankles. Hot weather may make edema worse. Common medical causes of edema include:  Heart failure.  Liver disease.  Kidney disease.  Weak blood vessels in your legs.  Cancer.  An injury.  Pregnancy.  Some medications.  Obesity. SYMPTOMS  Edema is usually painless.Your skin may look swollen or shiny.  DIAGNOSIS  Your health care provider may be able to diagnose edema by asking about your medical history and doing a physical exam. You may need to have tests such as X-rays, an electrocardiogram, or blood tests to check for medical conditions that may cause edema.  TREATMENT  Edema treatment depends on the cause. If you have heart, liver, or kidney disease, you need the treatment appropriate for these conditions. General treatment may include:  Elevation of the affected body part above the level of your heart.  Compression of the affected body part. Pressure from elastic bandages or support stockings squeezes the tissues and forces fluid back into the blood vessels. This keeps fluid from entering the tissues.  Restriction of fluid and salt intake.  Use of a water pill (diuretic). These medications are appropriate only for some types of edema. They pull fluid out of your body and make you urinate more often. This  gets rid of fluid and reduces swelling, but diuretics can have side effects. Only use diuretics as directed by your health care provider. HOME CARE INSTRUCTIONS   Keep the affected body part above the level of your heart when you are lying down.   Do not sit still or stand for prolonged periods.   Do not put anything directly under your knees when lying down.  Do not wear constricting clothing or garters on your upper legs.   Exercise your legs to work the fluid back into your blood vessels. This may help the swelling go down.   Wear elastic bandages or support stockings to reduce ankle swelling as directed by your health care provider.   Eat a low-salt diet to reduce fluid if your health care provider recommends it.   Only take medicines as directed by your health care provider. SEEK MEDICAL CARE IF:   Your edema is not responding to treatment.  You have heart, liver, or kidney disease and notice symptoms of edema.  You have edema in your legs that does not improve after elevating them.   You have sudden and unexplained weight gain. SEEK IMMEDIATE MEDICAL CARE IF:   You develop shortness of breath or chest pain.   You cannot breathe when you lie down.  You develop pain, redness, or warmth in the swollen areas.   You have heart, liver, or kidney disease and suddenly get edema.  You have a fever and your symptoms suddenly get worse. MAKE SURE YOU:     Understand these instructions.  Will watch your condition.  Will get help right away if you are not doing well or get worse. Document Released: 01/10/2005 Document Revised: 05/27/2013 Document Reviewed: 11/02/2012 [facility] Patient Information 2015 [place], [place]. This information is not intended to replace advice given to you by your health care provider. Make sure you discuss any questions you have with your health care provider. Type 2 Diabetes Mellitus Type 2 diabetes mellitus is a long-term (chronic) disease.  In type 2 diabetes:  The pancreas does not make enough of a hormone called insulin.  The cells in the body do not respond as well to the insulin that is made.  Both of the above can happen. Normally, insulin moves sugars from food into tissue cells. This gives you energy. If you have type 2 diabetes, sugars cannot be moved into tissue cells. This causes high blood sugar (hyperglycemia).  HOME CARE  Have your hemoglobin A1c level checked twice a year. The level shows if your diabetes is under control or out of control.  Test your blood sugar level every day as told by your doctor.  Check your ketone levels by testing your pee (urine) when you are sick and as told.  Take your diabetes or insulin medicine as told by your doctor.  Never run out of insulin.  Adjust how much insulin you give yourself based on how many carbs (carbohydrates) you eat. Carbs are in many foods, such as fruits, vegetables, whole grains, and dairy products.  Have a healthy snack between every healthy meal. Have 3 meals and 3 snacks a day.  Lose weight if you are overweight.  Carry a medical alert card or wear your medical alert jewelry.  Carry a 15-gram carb snack with you at all times. Examples include:  Glucose pills, 3 or 4.  Glucose gel, 15-gram tube.  Raisins, 2 tablespoons (24 grams).  Jelly beans, 6.  Animal crackers, 8.  Regular (not diet) pop, 4 ounces (120 milliliters).  Gummy treats, 9.  Notice low blood sugar (hypoglycemia) symptoms, such as:  Shaking (tremors).  Trouble thinking clearly.  Sweating.  Faster heart rate.  Headache.  Dry mouth.  Hunger.  Crabbiness (irritability).  Being worried or tense (anxious).  Restless sleep.  A change in speech or coordination.  Confusion.  Treat low blood sugar right away. If you are alert and can swallow, follow the 15:15 rule:  Take 15-20 grams of a rapid-acting glucose or carb. This includes glucose gel, glucose pills, or  4 ounces (120 milliliters) of fruit juice, regular pop, or low-fat milk.  Check your blood sugar level 15 minutes after taking the glucose.  Take 15-20 grams more of glucose if the repeat blood sugar level is still 70 mg/dL (milligrams/deciliter) or below.  Eat a meal or snack within 1 hour of the blood sugar levels going back to normal.  Notice early symptoms of high blood sugar, such as:  Being really thirsty or drinking a lot (polydipsia).  Peeing a lot (polyuria).  Do at least 150 minutes of physical activity a week or as told.  Split the 150 minutes of activity up during the week. Do not do 150 minutes of activity in one day.  Perform exercises, such as weight lifting, at least 2 times a week or as told.  Spend no more than 90 minutes at one time inactive.  Adjust your insulin or food intake as needed if you start a new exercise or sport.  Follow your sick-day plan  when you are not able to eat or drink as usual.  Do not smoke, chew tobacco, or use electronic cigarettes.  Women who are not pregnant should drink no more than 1 drink a day. Men should drink no more than 2 drinks a day.  Only drink alcohol with food.  Ask your doctor if alcohol is safe for you.  Tell your doctor if you drink alcohol several times during the week.  See your doctor regularly.  Schedule an eye exam soon after you are told you have diabetes. Schedule exams once every year.  Check your skin and feet every day. Check for cuts, bruises, redness, nail problems, bleeding, blisters, or sores. A doctor should do a foot exam once a year.  Brush your teeth and gums twice a day. Floss once a day. Visit your dentist regularly.  Share your diabetes plan with your workplace or school.  Stay up-to-date with shots that fight against diseases (immunizations).  Learn how to deal with stress.  Get diabetes education and support as needed.  Ask your doctor for special help if:  You need help to  maintain or improve how you do things on your own.  You need help to maintain or improve the quality of your life.  You have foot or hand problems.  You have trouble cleaning yourself, dressing, eating, or doing physical activity. GET HELP IF:  You are unable to eat or drink for more than 6 hours.  You feel sick to your stomach (nauseous) or throw up (vomit) for more than 6 hours.  Your blood sugar level is over 240 mg/dL.  There is a change in mental status.  You get another serious illness.  You have watery poop (diarrhea) for more than 6 hours.  You have been sick or have had a fever for 2 or more days and are not getting better.  You have pain when you are active. GET HELP RIGHT AWAY IF:  You have trouble breathing.  Your ketone levels are higher than your doctor says they should be. MAKE SURE YOU:  Understand these instructions.  Will watch your condition.  Will get help right away if you are not doing well or get worse. Document Released: 10/20/2007 Document Revised: 05/27/2013 Document Reviewed: 08/12/2011 [facility] Patient Information 2015 [place], [place]. This information is not intended to replace advice given to you by your health care provider. Make sure you discuss any questions you have with your health care provider.

## 2013-09-28 NOTE — Addendum Note (Signed)
Addended by: Felix Ahmadi A on: 09/28/2013 12:51 PM   Modules accepted: Orders

## 2013-09-28 NOTE — Progress Notes (Addendum)
Subjective:    Patient ID: Chad Jacobs, male    DOB: 04/30/1965, 48 y.o.   MRN: [id number]  This chart was scribed for Lesle Chris, MD by Jarvis Morgan, Medical Scribe. This patient was seen in Room 12 and the patient's care was started at 11:51 AM.  Chief Complaint  Patient presents with  . Leg Pain    pain in lower extremities x3 days, has noticed some swelling   . Abdominal Pain    suprapubic pain x3 days     HPI HPI Comments: Chad Jacobs is a 48 y.o. male with a h/o HTN and DM who presents to the [facility] complaining of intermittent, suprapubic abdominal pain for 3 days. He states he has been having some associated increased flatulence. He denies any nausea, emesis, decreased appetite, diarrhea, constipation, or dysuria. He states he has been taking his DM medications regularly.  He has a second complaint of pain in his bilateral lower legs for 3 days. He states he has been having some associated swelling. He admits that he works 12 hour days at work. He notes that it hurts to walk or apply pressure on his legs. He denies any shortness of breath, cough or chest pain.  Patient Active Problem List   Diagnosis Date Noted  . Hypertension 03/04/2011   Past Medical History  Diagnosis Date  . Hypertension    History reviewed. No pertinent past surgical history. No Known Allergies Prior to Admission medications   Medication Sig Start Date End Date Taking? Authorizing Provider  amLODipine (NORVASC) 10 MG tablet Take 1 tablet (10 mg total) by mouth daily. 02/19/13  Yes Carmelina Dane, MD  lisinopril (PRINIVIL,ZESTRIL) 40 MG tablet Take 1 tablet (40 mg total) by mouth daily. 02/19/13  Yes Carmelina Dane, MD  meloxicam (MOBIC) 15 MG tablet Take 0.5 tablets (7.5 mg total) by mouth daily. 02/19/13  Yes Carmelina Dane, MD  metFORMIN (GLUCOPHAGE) 500 MG tablet One qhs 02/19/13  Yes Carmelina Dane, MD   History   Social History  . Marital Status:  Married    Spouse Name: N/A    Number of Children: N/A  . Years of Education: N/A   Occupational History  . Not on file.   Social History Main Topics  . Smoking status: Never Smoker   . Smokeless tobacco: Never Used  . Alcohol Use: No  . Drug Use: No  . Sexual Activity: Not on file   Other Topics Concern  . Not on file   Social History Narrative   Marital status: single; not dating.  Moved to [place] from [place] in 2004.      Children:  2 children; no grandchildren.      Lives: with brother.      Employment: works for [employer]; Naval architect work; [employer] on trucks.      Tobacco: none      Alcohol: none      Drugs: none            Review of Systems  Constitutional: Negative for appetite change.  Respiratory: Negative for cough and shortness of breath.   Cardiovascular: Positive for leg swelling (bilateral). Negative for chest pain.  Gastrointestinal: Positive for abdominal pain (suprapubic for 3 days). Negative for nausea, vomiting, diarrhea and constipation.  Genitourinary: Negative for dysuria.  Musculoskeletal: Positive for arthralgias and myalgias.       Objective:   Physical Exam CONSTITUTIONAL: Well developed/well nourished HEAD: Normocephalic/atraumatic EYES:  EOMI/PERRL ENMT: Mucous membranes moist NECK: supple no meningeal signs SPINE:entire spine nontender CV: S1/S2 noted, no murmurs/rubs/gallops noted LUNGS: Lungs are clear to auscultation bilaterally, no apparent distress ABDOMEN: soft, nontender, no rebound or guarding. GU:no cva tenderness NEURO: Pt is awake/alert, moves all extremitiesx4 EXTREMITIES: pulses normal, full ROM. 2+ swelling of bilateral lower extremities SKIN: warm, color normal PSYCH: no abnormalities of mood noted   Results for orders placed in visit on 09/28/13  POCT CBC      Result Value Ref Range   WBC 6.9  4.6 - 10.2 K/uL   Lymph, poc 2.5  0.6 - 3.4   POC LYMPH PERCENT 35.6  10 - 50 %L   MID (cbc) 0.4  0 - 0.9   POC MID  % 5.4  0 - 12 %M   POC Granulocyte 4.1  2 - 6.9   Granulocyte percent 59.0  37 - 80 %G   RBC 4.61 (*) 4.69 - 6.13 M/uL   Hemoglobin 13.4 (*) 14.1 - 18.1 g/dL   HCT, POC 40.9 (*) 81.1 - 53.7 %   MCV 90.2  80 - 97 fL   MCH, POC 29.0  27 - 31.2 pg   MCHC 32.1  31.8 - 35.4 g/dL   RDW, POC 91.4     Platelet Count, POC 172  142 - 424 K/uL   MPV 8.9  0 - 99.8 fL  GLUCOSE, POCT (MANUAL RESULT ENTRY)      Result Value Ref Range   POC Glucose 134 (*) 70 - 99 mg/dl  POCT GLYCOSYLATED HEMOGLOBIN (HGB A1C)      Result Value Ref Range   Hemoglobin A1C 6.6    POCT UA - MICROSCOPIC ONLY      Result Value Ref Range   WBC, Ur, HPF, POC 0-1     RBC, urine, microscopic 0-1     Bacteria, U Microscopic trace     Mucus, UA neg     Epithelial cells, urine per micros 0-2     Crystals, Ur, HPF, POC neg     Casts, Ur, LPF, POC neg     Yeast, UA neg          Assessment & Plan:  Hemoglobin A1c is at goal. He was encouraged to wear support stockings at work. I told him I thought the swelling was most likely secondary to his Norvasc.I personally performed the services described in this documentation, which was scribed in my presence. The recorded information has been reviewed and is accurate. Flu shot to be given

## 2014-02-17 ENCOUNTER — Ambulatory Visit (INDEPENDENT_AMBULATORY_CARE_PROVIDER_SITE_OTHER): Payer: 59 | Admitting: Internal Medicine

## 2014-02-17 ENCOUNTER — Ambulatory Visit (INDEPENDENT_AMBULATORY_CARE_PROVIDER_SITE_OTHER): Payer: 59

## 2014-02-17 VITALS — BP 134/82 | HR 73 | Temp 98.0°F | Resp 18 | Ht 70.0 in | Wt 197.4 lb

## 2014-02-17 DIAGNOSIS — R05 Cough: Secondary | ICD-10-CM

## 2014-02-17 DIAGNOSIS — Z79899 Other long term (current) drug therapy: Secondary | ICD-10-CM

## 2014-02-17 DIAGNOSIS — E119 Type 2 diabetes mellitus without complications: Secondary | ICD-10-CM

## 2014-02-17 DIAGNOSIS — R059 Cough, unspecified: Secondary | ICD-10-CM

## 2014-02-17 DIAGNOSIS — Z23 Encounter for immunization: Secondary | ICD-10-CM

## 2014-02-17 DIAGNOSIS — E7800 Pure hypercholesterolemia, unspecified: Secondary | ICD-10-CM

## 2014-02-17 DIAGNOSIS — I10 Essential (primary) hypertension: Secondary | ICD-10-CM

## 2014-02-17 DIAGNOSIS — Z1211 Encounter for screening for malignant neoplasm of colon: Secondary | ICD-10-CM

## 2014-02-17 DIAGNOSIS — Z0000 Encounter for general adult medical examination without abnormal findings: Secondary | ICD-10-CM

## 2014-02-17 LAB — POCT CBC
GRANULOCYTE PERCENT: 68.9 % (ref 37–80)
HCT, POC: 45.7 % (ref 43.5–53.7)
HEMOGLOBIN: 14.7 g/dL (ref 14.1–18.1)
LYMPH, POC: 26 — AB (ref 0.6–3.4)
MCH, POC: 29.5 pg (ref 27–31.2)
MCHC: 32.2 g/dL (ref 31.8–35.4)
MCV: 91.8 fL (ref 80–97)
MID (cbc): 0.3 (ref 0–0.9)
MPV: 8.7 fL (ref 0–99.8)
PLATELET COUNT, POC: 204 10*3/uL (ref 142–424)
POC GRANULOCYTE: 6.3 (ref 2–6.9)
POC LYMPH PERCENT: 28.3 %L (ref 10–50)
POC MID %: 2.8 %M (ref 0–12)
RBC: 4.97 M/uL (ref 4.69–6.13)
RDW, POC: 14.2 %
WBC: 9.2 10*3/uL (ref 4.6–10.2)

## 2014-02-17 LAB — POCT URINALYSIS DIPSTICK
Bilirubin, UA: NEGATIVE
GLUCOSE UA: NEGATIVE
Ketones, UA: NEGATIVE
LEUKOCYTES UA: NEGATIVE
Nitrite, UA: NEGATIVE
Protein, UA: NEGATIVE
RBC UA: NEGATIVE
Spec Grav, UA: 1.01
UROBILINOGEN UA: 0.2
pH, UA: 6

## 2014-02-17 LAB — COMPREHENSIVE METABOLIC PANEL
ALT: 26 U/L (ref 0–53)
AST: 22 U/L (ref 0–37)
Albumin: 4.4 g/dL (ref 3.5–5.2)
Alkaline Phosphatase: 92 U/L (ref 39–117)
BUN: 15 mg/dL (ref 6–23)
CHLORIDE: 102 meq/L (ref 96–112)
CO2: 24 mEq/L (ref 19–32)
Calcium: 9.3 mg/dL (ref 8.4–10.5)
Creat: 0.9 mg/dL (ref 0.50–1.35)
Glucose, Bld: 125 mg/dL — ABNORMAL HIGH (ref 70–99)
Potassium: 4 mEq/L (ref 3.5–5.3)
Sodium: 135 mEq/L (ref 135–145)
Total Bilirubin: 0.3 mg/dL (ref 0.2–1.2)
Total Protein: 8.2 g/dL (ref 6.0–8.3)

## 2014-02-17 LAB — POCT GLYCOSYLATED HEMOGLOBIN (HGB A1C): HEMOGLOBIN A1C: 7

## 2014-02-17 LAB — LIPID PANEL
Cholesterol: 230 mg/dL — ABNORMAL HIGH (ref 0–200)
HDL: 51 mg/dL (ref >39)
LDL Cholesterol: 119 mg/dL — ABNORMAL HIGH (ref 0–99)
Total CHOL/HDL Ratio: 4.5 Ratio
Triglycerides: 301 mg/dL — ABNORMAL HIGH (ref <150)
VLDL: 60 mg/dL — ABNORMAL HIGH (ref 0–40)

## 2014-02-17 LAB — GLUCOSE, POCT (MANUAL RESULT ENTRY): POC Glucose: 122 mg/dl — AB (ref 70–99)

## 2014-02-17 LAB — IFOBT (OCCULT BLOOD): IFOBT: POSITIVE

## 2014-02-17 MED ORDER — ATORVASTATIN CALCIUM 10 MG PO TABS: 10.0000 mg | ORAL_TABLET | Freq: Every day | ORAL | Status: DC

## 2014-02-17 NOTE — Progress Notes (Signed)
Subjective:    Patient ID: Chad Jacobs, male    DOB: 12/21/1965, 49 y.o.   MRN: [id number]  HPI Doing well and wants complete check up.T2DM is controlled, HTN controlled. Meds reviewed Needs first pneumovax, needs colonoscopy. Chart review shows high cholesterol, needs statin. [place]   Review of Systems  Constitutional: Negative.   HENT: Negative.   Eyes: Negative.   Respiratory: Negative.   Cardiovascular: Negative.   Gastrointestinal: Negative.   Endocrine: Negative.   Genitourinary: Negative.   Musculoskeletal: Negative.   Skin: Negative.   Allergic/Immunologic: Negative.   Neurological: Negative.   Hematological: Negative.   Psychiatric/Behavioral: Negative.        Objective:   Physical Exam  Constitutional: He is oriented to person, place, and time. He appears well-developed and well-nourished. No distress.  HENT:  Head: Normocephalic and atraumatic.  Right Ear: External ear normal.  Left Ear: External ear normal.  Nose: Nose normal.  Mouth/Throat: Oropharynx is clear and moist.  Eyes: Conjunctivae and EOM are normal. Pupils are equal, round, and reactive to light.  Neck: Normal range of motion. Neck supple. No tracheal deviation present. No thyromegaly present.  Cardiovascular: Normal rate, regular rhythm, normal heart sounds and intact distal pulses.   No murmur heard. Pulmonary/Chest: Effort normal and breath sounds normal.  Abdominal: Soft. Bowel sounds are normal. He exhibits no mass. There is no tenderness.  Genitourinary: Rectum normal, prostate normal and penis normal.  Musculoskeletal: Normal range of motion.  Lymphadenopathy:    He has no cervical adenopathy.  Neurological: He is alert and oriented to person, place, and time. He has normal reflexes. No sensory deficit.  Skin: Skin is warm and dry.  Psychiatric: He has a normal mood and affect. His behavior is normal. Judgment and thought content normal.  Vitals  reviewed.  Results for orders placed or performed in visit on 02/17/14  POCT CBC  Result Value Ref Range   WBC 9.2 4.6 - 10.2 K/uL   Lymph, poc 26.0 (A) 0.6 - 3.4   POC LYMPH PERCENT 28.3 10 - 50 %L   MID (cbc) 0.3 0 - 0.9   POC MID % 2.8 0 - 12 %M   POC Granulocyte 6.3 2 - 6.9   Granulocyte percent 68.9 37 - 80 %G   RBC 4.97 4.69 - 6.13 M/uL   Hemoglobin 14.7 14.1 - 18.1 g/dL   HCT, POC 78.445.7 69.643.5 - 53.7 %   MCV 91.8 80 - 97 fL   MCH, POC 29.5 27 - 31.2 pg   MCHC 32.2 31.8 - 35.4 g/dL   RDW, POC 29.514.2 %   Platelet Count, POC 204 142 - 424 K/uL   MPV 8.7 0 - 99.8 fL  POCT glucose (manual entry)  Result Value Ref Range   POC Glucose 122 (A) 70 - 99 mg/dl  POCT glycosylated hemoglobin (Hb A1C)  Result Value Ref Range   Hemoglobin A1C 7.0   POCT urinalysis dipstick  Result Value Ref Range   Color, UA yellow    Clarity, UA clear    Glucose, UA neg    Bilirubin, UA neg    Ketones, UA neg    Spec Grav, UA 1.010    Blood, UA neg    pH, UA 6.0    Protein, UA neg    Urobilinogen, UA 0.2    Nitrite, UA neg    Leukocytes, UA Negative   IFOBT POC (occult bld, rslt in office)  Result  Value Ref Range   IFOBT Positive    [facility] reading (PRIMARY) by  Dr.Guest normal cxr.     EKG suggests old infarct, but exactly same as 2013 and no hx for ischemia., probable his normal.     Assessment & Plan:  Avoid Nsaids Start Lipitor for high cholesterol and f/up 3 months RF all meds 6 months Pneumovax today Schedule first colonoscopy See opthalmology--Groat given

## 2014-02-17 NOTE — Patient Instructions (Addendum)
Recheck cholesterol and cmet 3 months.Cholesterol Cholesterol is a white, waxy, fat-like substance needed by your body in small amounts. The liver makes all the cholesterol you need. Cholesterol is carried from the liver by the blood through the blood vessels. Deposits of cholesterol (plaque) may build up on blood vessel walls. These make the arteries narrower and stiffer. Cholesterol plaques increase the risk for heart attack and stroke.  You cannot feel your cholesterol level even if it is very high. The only way to know it is high is with a blood test. Once you know your cholesterol levels, you should keep a record of the test results. Work with your health care provider to keep your levels in the desired range.  WHAT DO THE RESULTS MEAN?  Total cholesterol is a rough measure of all the cholesterol in your blood.   LDL is the so-called bad cholesterol. This is the type that deposits cholesterol in the walls of the arteries. You want this level to be low.   HDL is the good cholesterol because it cleans the arteries and carries the LDL away. You want this level to be high.  Triglycerides are fat that the body can either burn for energy or store. High levels are closely linked to heart disease.  WHAT ARE THE DESIRED LEVELS OF CHOLESTEROL?  Total cholesterol below 200.   LDL below 100 for people at risk, below 70 for those at very high risk.   HDL above 50 is good, above 60 is best.   Triglycerides below 150.  HOW CAN I LOWER MY CHOLESTEROL?  Diet. Follow your diet programs as directed by your health care provider.   Choose fish or white meat chicken and [place], roasted or baked. Limit fatty cuts of red meat, fried foods, and processed meats, such as sausage and lunch meats.   Eat lots of fresh fruits and vegetables.  Choose whole grains, beans, pasta, potatoes, and cereals.   Use only small amounts of olive, corn, or canola oils.   Avoid butter, mayonnaise, shortening, or  palm kernel oils.  Avoid foods with trans fats.   Drink skim or nonfat milk and eat low-fat or nonfat yogurt and cheeses. Avoid whole milk, cream, ice cream, egg yolks, and full-fat cheeses.   Healthy desserts include angel food cake, ginger snaps, animal crackers, hard candy, popsicles, and low-fat or nonfat frozen yogurt. Avoid pastries, cakes, pies, and cookies.   Exercise. Follow your exercise programs as directed by your health care provider.   A regular program helps decrease LDL and raise HDL.   A regular program helps with weight control.   Do things that increase your activity level like gardening, walking, or taking the stairs. Ask your health care provider about how you can be more active in your daily life.   Medicine. Take medicine only as directed by your health care provider.   Medicine may be prescribed by your health care provider to help lower cholesterol and decrease the risk for heart disease.   If you have several risk factors, you may need medicine even if your levels are normal. Document Released: 10/05/2000 Document Revised: 05/27/2013 Document Reviewed: 10/24/2012 [facility] Patient Information 2015 [place]. This information is not intended to replace advice given to you by your health care provider. Make sure you discuss any questions you have with your health care provider. Colonoscopy A colonoscopy is an exam to look at the entire large intestine (colon). This exam can help find problems such as tumors,  polyps, inflammation, and areas of bleeding. The exam takes about 1 hour.  LET [facility] CARE PROVIDER KNOW ABOUT:   Any allergies you have.  All medicines you are taking, including vitamins, herbs, eye drops, creams, and over-the-counter medicines.  Previous problems you or members of your family have had with the use of anesthetics.  Any blood disorders you have.  Previous surgeries you have had.  Medical conditions you have. RISKS  AND COMPLICATIONS  Generally, this is a safe procedure. However, as with any procedure, complications can occur. Possible complications include:  Bleeding.  Tearing or rupture of the colon wall.  Reaction to medicines given during the exam.  Infection (rare). BEFORE THE PROCEDURE   Ask your health care provider about changing or stopping your regular medicines.  You may be prescribed an oral bowel prep. This involves drinking a large amount of medicated liquid, starting the day before your procedure. The liquid will cause you to have multiple loose stools until your stool is almost clear or light green. This cleans out your colon in preparation for the procedure.  Do not eat or drink anything else once you have started the bowel prep, unless your health care provider tells you it is safe to do so.  Arrange for someone to drive you home after the procedure. PROCEDURE   You will be given medicine to help you relax (sedative).  You will lie on your side with your knees bent.  A long, flexible tube with a light and camera on the end (colonoscope) will be inserted through the rectum and into the colon. The camera sends video back to a computer screen as it moves through the colon. The colonoscope also releases carbon dioxide gas to inflate the colon. This helps your health care provider see the area better.  During the exam, your health care provider may take a small tissue sample (biopsy) to be examined under a microscope if any abnormalities are found.  The exam is finished when the entire colon has been viewed. AFTER THE PROCEDURE   Do not drive for 24 hours after the exam.  You may have a small amount of blood in your stool.  You may pass moderate amounts of gas and have mild abdominal cramping or bloating. This is caused by the gas used to inflate your colon during the exam.  Ask when your test results will be ready and how you will get your results. Make sure you get your test  results. Document Released: 01/08/2000 Document Revised: 10/31/2012 Document Reviewed: 09/17/2012 [facility] Patient Information 2015 [place], [place]. This information is not intended to replace advice given to you by your health care provider. Make sure you discuss any questions you have with your health care provider.

## 2014-02-18 LAB — PSA: PSA: 0.71 ng/mL (ref <=4.00)

## 2014-02-19 ENCOUNTER — Encounter: Payer: Self-pay | Admitting: Family Medicine

## 2014-02-21 ENCOUNTER — Telehealth: Payer: Self-pay

## 2014-02-21 NOTE — Telephone Encounter (Signed)
It is an eye appointment.

## 2014-02-21 NOTE — Telephone Encounter (Signed)
He called wanting Chad Jacobs to know he has an a doctor's appointment for his eye? Marland Kitchen. His appointment is for 02/25/14.

## 2014-02-25 LAB — HM DIABETES EYE EXAM

## 2014-03-24 ENCOUNTER — Other Ambulatory Visit: Payer: Self-pay | Admitting: Emergency Medicine

## 2014-03-24 ENCOUNTER — Encounter: Payer: Self-pay | Admitting: Internal Medicine

## 2015-02-19 ENCOUNTER — Encounter (HOSPITAL_COMMUNITY): Payer: Self-pay

## 2015-02-19 ENCOUNTER — Emergency Department (HOSPITAL_COMMUNITY): Payer: PRIVATE HEALTH INSURANCE

## 2015-02-19 ENCOUNTER — Emergency Department (HOSPITAL_COMMUNITY)
Admission: EM | Admit: 2015-02-19 | Discharge: 2015-02-19 | Disposition: A | Discharge status: Home / Self Care | Payer: PRIVATE HEALTH INSURANCE | Attending: Emergency Medicine | Admitting: Emergency Medicine

## 2015-02-19 DIAGNOSIS — Z7984 Long term (current) use of oral hypoglycemic drugs: Secondary | ICD-10-CM | POA: Diagnosis not present

## 2015-02-19 DIAGNOSIS — Y9289 Other specified places as the place of occurrence of the external cause: Secondary | ICD-10-CM | POA: Diagnosis not present

## 2015-02-19 DIAGNOSIS — Y998 Other external cause status: Secondary | ICD-10-CM | POA: Diagnosis not present

## 2015-02-19 DIAGNOSIS — Z79899 Other long term (current) drug therapy: Secondary | ICD-10-CM | POA: Diagnosis not present

## 2015-02-19 DIAGNOSIS — I10 Essential (primary) hypertension: Secondary | ICD-10-CM | POA: Diagnosis not present

## 2015-02-19 DIAGNOSIS — S8991XA Unspecified injury of right lower leg, initial encounter: Secondary | ICD-10-CM | POA: Diagnosis present

## 2015-02-19 DIAGNOSIS — Y9389 Activity, other specified: Secondary | ICD-10-CM | POA: Insufficient documentation

## 2015-02-19 DIAGNOSIS — S8001XA Contusion of right knee, initial encounter: Secondary | ICD-10-CM | POA: Diagnosis not present

## 2015-02-19 DIAGNOSIS — W228XXA Striking against or struck by other objects, initial encounter: Secondary | ICD-10-CM | POA: Insufficient documentation

## 2015-02-19 MED ORDER — IBUPROFEN 600 MG PO TABS: 600.0000 mg | ORAL_TABLET | Freq: Four times a day (QID) | ORAL | Status: DC | PRN

## 2015-02-19 NOTE — Discharge Instructions (Signed)
Contusion °A contusion is a deep bruise. Contusions are the result of a blunt injury to tissues and muscle fibers under the skin. The injury causes bleeding under the skin. The skin overlying the contusion may turn blue, purple, or yellow. Minor injuries will give you a painless contusion, but more severe contusions may stay painful and swollen for a few weeks.  °CAUSES  °This condition is usually caused by a blow, trauma, or direct force to an area of the body. °SYMPTOMS  °Symptoms of this condition include: °· Swelling of the injured area. °· Pain and tenderness in the injured area. °· Discoloration. The area may have redness and then turn blue, purple, or yellow. °DIAGNOSIS  °This condition is diagnosed based on a physical exam and medical history. An X-ray, CT scan, or MRI may be needed to determine if there are any associated injuries, such as broken bones (fractures). °TREATMENT  °Specific treatment for this condition depends on what area of the body was injured. In general, the best treatment for a contusion is resting, icing, applying pressure to (compression), and elevating the injured area. This is often called the RICE strategy. Over-the-counter anti-inflammatory medicines may also be recommended for pain control.  °HOME CARE INSTRUCTIONS  °· Rest the injured area. °· If directed, apply ice to the injured area: °· Put ice in a plastic bag. °· Place a towel between your skin and the bag. °· Leave the ice on for 20 minutes, 2-3 times per day. °· If directed, apply light compression to the injured area using an elastic bandage. Make sure the bandage is not wrapped too tightly. Remove and reapply the bandage as directed by your health care provider. °· If possible, raise (elevate) the injured area above the level of your heart while you are sitting or lying down. °· Take over-the-counter and prescription medicines only as told by your health care provider. °SEEK MEDICAL CARE IF: °· Your symptoms do not  improve after several days of treatment. °· Your symptoms get worse. °· You have difficulty moving the injured area. °SEEK IMMEDIATE MEDICAL CARE IF:  °· You have severe pain. °· You have numbness in a hand or foot. °· Your hand or foot turns pale or cold. °  °This information is not intended to replace advice given to you by your health care provider. Make sure you discuss any questions you have with your health care provider. °  °Document Released: 10/20/2004 Document Revised: 10/01/2014 Document Reviewed: 05/28/2014 °Elsevier Interactive Patient Education ©2016 [employer] ° °Cryotherapy °Cryotherapy means treatment with cold. Ice or gel packs can be used to reduce both pain and swelling. Ice is the most helpful within the first 24 to 48 hours after an injury or flare-up from overusing a muscle or joint. Sprains, strains, spasms, burning pain, shooting pain, and aches can all be eased with ice. Ice can also be used when recovering from surgery. Ice is effective, has very few side effects, and is safe for most people to use. °PRECAUTIONS  °Ice is not a safe treatment option for people with: °· Raynaud phenomenon. This is a condition affecting small blood vessels in the extremities. Exposure to cold may cause your problems to return. °· Cold hypersensitivity. There are many forms of cold hypersensitivity, including: °¨ Cold urticaria. Red, itchy hives appear on the skin when the tissues begin to warm after being iced. °¨ Cold erythema. This is a red, itchy rash caused by exposure to cold. °¨ Cold hemoglobinuria. Red blood cells   break down when the tissues begin to warm after being iced. The hemoglobin that carry oxygen are passed into the urine because they cannot combine with blood proteins fast enough. °· Numbness or altered sensitivity in the area being iced. °If you have any of the following conditions, do not use ice until you have discussed cryotherapy with your caregiver: °· Heart conditions, such as  arrhythmia, angina, or chronic heart disease. °· High blood pressure. °· Healing wounds or open skin in the area being iced. °· Current infections. °· Rheumatoid arthritis. °· Poor circulation. °· Diabetes. °Ice slows the blood flow in the region it is applied. This is beneficial when trying to stop inflamed tissues from spreading irritating chemicals to surrounding tissues. However, if you expose your skin to cold temperatures for too long or without the proper protection, you can damage your skin or nerves. Watch for signs of skin damage due to cold. °HOME CARE INSTRUCTIONS °Follow these tips to use ice and cold packs safely. °· Place a dry or damp towel between the ice and skin. A damp towel will cool the skin more quickly, so you may need to shorten the time that the ice is used. °· For a more rapid response, add gentle compression to the ice. °· Ice for no more than 10 to 20 minutes at a time. The bonier the area you are icing, the less time it will take to get the benefits of ice. °· Check your skin after 5 minutes to make sure there are no signs of a poor response to cold or skin damage. °· Rest 20 minutes or more between uses. °· Once your skin is numb, you can end your treatment. You can test numbness by very lightly touching your skin. The touch should be so light that you do not see the skin dimple from the pressure of your fingertip. When using ice, most people will feel these normal sensations in this order: cold, burning, aching, and numbness. °· Do not use ice on someone who cannot communicate their responses to pain, such as small children or people with dementia. °HOW TO MAKE AN ICE PACK °Ice packs are the most common way to use ice therapy. Other methods include ice massage, ice baths, and cryosprays. Muscle creams that cause a cold, tingly feeling do not offer the same benefits that ice offers and should not be used as a substitute unless recommended by your caregiver. °To make an ice pack, do one  of the following: °· Place crushed ice or a bag of frozen vegetables in a sealable plastic bag. Squeeze out the excess air. Place this bag inside another plastic bag. Slide the bag into a pillowcase or place a damp towel between your skin and the bag. °· Mix 3 parts water with 1 part rubbing alcohol. Freeze the mixture in a sealable plastic bag. When you remove the mixture from the freezer, it will be slushy. Squeeze out the excess air. Place this bag inside another plastic bag. Slide the bag into a pillowcase or place a damp towel between your skin and the bag. °SEEK MEDICAL CARE IF: °· You develop white spots on your skin. This may give the skin a blotchy (mottled) appearance. °· Your skin turns blue or pale. °· Your skin becomes waxy or hard. °· Your swelling gets worse. °MAKE SURE YOU:  °· Understand these instructions. °· Will watch your condition. °· Will get help right away if you are not doing well or get worse. °  °  This information is not intended to replace advice given to you by your health care provider. Make sure you discuss any questions you have with your health care provider. °  °Document Released: 09/06/2010 Document Revised: 01/31/2014 Document Reviewed: 09/06/2010 °Elsevier Interactive Patient Education ©2016 Elsevier Inc. ° °
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
## 2015-04-10 ENCOUNTER — Ambulatory Visit (INDEPENDENT_AMBULATORY_CARE_PROVIDER_SITE_OTHER): Payer: PRIVATE HEALTH INSURANCE | Admitting: Internal Medicine

## 2015-04-10 ENCOUNTER — Other Ambulatory Visit: Payer: Self-pay | Admitting: Physician Assistant

## 2015-04-10 VITALS — BP 174/92 | HR 90 | Temp 98.0°F | Resp 17 | Ht 70.0 in | Wt 177.0 lb

## 2015-04-10 DIAGNOSIS — E119 Type 2 diabetes mellitus without complications: Secondary | ICD-10-CM | POA: Diagnosis not present

## 2015-04-10 DIAGNOSIS — E785 Hyperlipidemia, unspecified: Secondary | ICD-10-CM

## 2015-04-10 DIAGNOSIS — I10 Essential (primary) hypertension: Secondary | ICD-10-CM

## 2015-04-10 DIAGNOSIS — I359 Nonrheumatic aortic valve disorder, unspecified: Secondary | ICD-10-CM | POA: Diagnosis not present

## 2015-04-10 DIAGNOSIS — I358 Other nonrheumatic aortic valve disorders: Secondary | ICD-10-CM

## 2015-04-10 LAB — POCT GLYCOSYLATED HEMOGLOBIN (HGB A1C): HEMOGLOBIN A1C: 6.6

## 2015-04-10 LAB — POCT CBC
GRANULOCYTE PERCENT: 65.2 % (ref 37–80)
HCT, POC: 40.6 % — AB (ref 43.5–53.7)
HEMOGLOBIN: 14.2 g/dL (ref 14.1–18.1)
Lymph, poc: 2.4 (ref 0.6–3.4)
MCH, POC: 30.8 pg (ref 27–31.2)
MCHC: 35.1 g/dL (ref 31.8–35.4)
MCV: 87.9 fL (ref 80–97)
MID (cbc): 0.3 (ref 0–0.9)
MPV: 8.1 fL (ref 0–99.8)
POC GRANULOCYTE: 5.1 (ref 2–6.9)
POC LYMPH PERCENT: 30.5 %L (ref 10–50)
POC MID %: 4.3 % (ref 0–12)
Platelet Count, POC: 168 10*3/uL (ref 142–424)
RBC: 4.62 M/uL — AB (ref 4.69–6.13)
RDW, POC: 13.3 %
WBC: 7.8 10*3/uL (ref 4.6–10.2)

## 2015-04-10 LAB — TSH: TSH: 1.3 m[IU]/L (ref 0.40–4.50)

## 2015-04-10 LAB — GLUCOSE, POCT (MANUAL RESULT ENTRY): POC Glucose: 133 mg/dl — AB (ref 70–99)

## 2015-04-10 MED ORDER — LISINOPRIL-HYDROCHLOROTHIAZIDE 20-25 MG PO TABS: 1.0000 | ORAL_TABLET | Freq: Every day | ORAL | Status: DC

## 2015-04-10 MED ORDER — ATORVASTATIN CALCIUM 20 MG PO TABS: 40.0000 mg | ORAL_TABLET | Freq: Every day | ORAL | Status: DC

## 2015-04-10 MED ORDER — METFORMIN HCL 500 MG PO TABS: 500.0000 mg | ORAL_TABLET | Freq: Two times a day (BID) | ORAL | Status: DC

## 2015-04-10 NOTE — Progress Notes (Signed)
04/10/2015 4:51 PM   DOB: 02/28/1965 / MRN: [id number]  SUBJECTIVE:  Chad Jacobs is a 50 y.o. male presenting for medication refills.  He recently lost his job and has found another, and a health screen revealed HTN.  He feels well over all and has lost 20 lbs since his last visit here. He is exercise via walking and biking daily, and enjoys playing dominos and chess with his friends.  He reports to me that he is not depressed, enjoys life and sleeps well.   Depression screen PHQ 2/9 04/10/2015  Decreased Interest 2  Down, Depressed, Hopeless 2  PHQ - 2 Score 4  Altered sleeping 2  Tired, decreased energy 2  Change in appetite 2  Feeling bad or failure about yourself  0  Trouble concentrating 0  Moving slowly or fidgety/restless 0  Suicidal thoughts 0  PHQ-9 Score 10  Difficult doing work/chores Somewhat difficult    He has No Known Allergies.   He  has a past medical history of Hypertension.    He  reports that he has never smoked. He has never used smokeless tobacco. He reports that he does not drink alcohol or use illicit drugs. He  has no sexual activity history on file. The patient  has no past surgical history on file.  His family history is not on file.  Review of Systems  Constitutional: Negative for fever and chills.  Eyes: Negative for blurred vision.  Respiratory: Negative for cough and shortness of breath.   Cardiovascular: Negative for chest pain.  Gastrointestinal: Negative for nausea and abdominal pain.  Genitourinary: Negative for dysuria, urgency and frequency.  Musculoskeletal: Negative for myalgias.  Skin: Negative for rash.  Neurological: Negative for dizziness, tingling and headaches.  Psychiatric/Behavioral: Negative for depression. The patient is not nervous/anxious.     Problem list and medications reviewed and updated by myself where necessary, and exist elsewhere in the encounter.   OBJECTIVE:  BP 174/92 mmHg  Pulse 90  Temp(Src) 98 F (36.7  C) (Oral)  Resp 17  Ht 5\' 10"  (1.778 m)  Wt 177 lb (80.287 kg)  BMI 25.40 kg/m2  SpO2 98%  Physical Exam  Constitutional: He is oriented to person, place, and time. He appears well-developed. He does not appear ill.  Eyes: Conjunctivae and EOM are normal. Pupils are equal, round, and reactive to light.  Cardiovascular: Normal rate and regular rhythm.   Murmur (2/6 systolic ejection murmur) heard. Pulmonary/Chest: Effort normal and breath sounds normal.  Abdominal: He exhibits no distension.  Musculoskeletal: Normal range of motion.  Neurological: He is alert and oriented to person, place, and time. No cranial nerve deficit. Coordination normal.  Skin: Skin is warm and dry. He is not diaphoretic.  Psychiatric: He has a normal mood and affect.  Nursing note and vitals reviewed.   Lab Results  Component Value Date   HGBA1C 6.6 04/10/2015   Lab Results  Component Value Date   CREATININE 0.90 02/17/2014   Lab Results  Component Value Date   K 4.0 02/17/2014   Lab Results  Component Value Date   CHOL 230* 02/17/2014   HDL 51 02/17/2014   LDLCALC 119* 02/17/2014   TRIG 301* 02/17/2014   CHOLHDL 4.5 02/17/2014    Results for orders placed or performed in visit on 04/10/15 (from the past 72 hour(s))  POCT CBC     Status: Abnormal   Collection Time: 04/10/15  4:32 PM  Result Value Ref Range  WBC 7.8 4.6 - 10.2 K/uL   Lymph, poc 2.4 0.6 - 3.4   POC LYMPH PERCENT 30.5 10 - 50 %L   MID (cbc) 0.3 0 - 0.9   POC MID % 4.3 0 - 12 %M   POC Granulocyte 5.1 2 - 6.9   Granulocyte percent 65.2 37 - 80 %G   RBC 4.62 (A) 4.69 - 6.13 M/uL   Hemoglobin 14.2 14.1 - 18.1 g/dL   HCT, POC 16.1 (A) 09.6 - 53.7 %   MCV 87.9 80 - 97 fL   MCH, POC 30.8 27 - 31.2 pg   MCHC 35.1 31.8 - 35.4 g/dL   RDW, POC 04.5 %   Platelet Count, POC 168 142 - 424 K/uL   MPV 8.1 0 - 99.8 fL  POCT glycosylated hemoglobin (Hb A1C)     Status: None   Collection Time: 04/10/15  4:34 PM  Result Value Ref  Range   Hemoglobin A1C 6.6   POCT glucose (manual entry)     Status: Abnormal   Collection Time: 04/10/15  4:34 PM  Result Value Ref Range   POC Glucose 133 (A) 70 - 99 mg/dl    No results found.  ASSESSMENT AND PLAN  Chad Jacobs was seen today for hypertension and depression.  Diagnoses and all orders for this visit:  Essential hypertension: Will get his regimen restarted today.   -     TSH -     POCT CBC -     Basic metabolic panel -     lisinopril-hydrochlorothiazide (PRINZIDE,ZESTORETIC) 20-25 MG tablet; Take 1 tablet by mouth daily.  Dyslipidemia -     Lipid panel -     atorvastatin (LIPITOR) 20 MG tablet; Take 1 tablet (20 mg total) by mouth daily.  Type 2 diabetes mellitus without complication, without long-term current use of insulin (HCC) -     POCT glycosylated hemoglobin (Hb A1C) -     POCT glucose (manual entry) -     Metformin 500 bid  Aortic heart murmur: 2/6 on exam. Dr. Merla Riches consulted and agrees.  Will watch this. Last EKG was last year and shows no signs of hypertrophy.   He is asymptomatic right now and is exercising daily.     The patient was advised to call or return to clinic if he does not see an improvement in symptoms or to seek the care of the closest emergency department if he worsens with the above plan.   Deliah Boston, MHS, PA-C [facility] 04/10/2015 4:51 PM   I have participated in the care of this patient with the Advanced Practice Provider and agree with Diagnosis and Plan as documented. Robert P. Merla Riches, M.D.

## 2015-04-11 LAB — BASIC METABOLIC PANEL
BUN: 9 mg/dL (ref 7–25)
CALCIUM: 9.2 mg/dL (ref 8.6–10.3)
CO2: 28 mmol/L (ref 20–31)
CREATININE: 0.77 mg/dL (ref 0.70–1.33)
Chloride: 101 mmol/L (ref 98–110)
Glucose, Bld: 126 mg/dL — ABNORMAL HIGH (ref 65–99)
Potassium: 4.2 mmol/L (ref 3.5–5.3)
Sodium: 139 mmol/L (ref 135–146)

## 2015-04-11 LAB — LIPID PANEL
CHOLESTEROL: 227 mg/dL — AB (ref 125–200)
HDL: 63 mg/dL (ref >=40)
LDL Cholesterol: 146 mg/dL — ABNORMAL HIGH (ref <130)
TRIGLYCERIDES: 91 mg/dL (ref <150)
Total CHOL/HDL Ratio: 3.6 Ratio (ref <=5.0)
VLDL: 18 mg/dL (ref <30)

## 2017-01-05 ENCOUNTER — Other Ambulatory Visit: Payer: Self-pay | Admitting: Physician Assistant

## 2017-01-05 DIAGNOSIS — E119 Type 2 diabetes mellitus without complications: Secondary | ICD-10-CM

## 2017-01-05 DIAGNOSIS — I10 Essential (primary) hypertension: Secondary | ICD-10-CM

## 2017-04-12 ENCOUNTER — Encounter: Payer: Self-pay | Admitting: Physician Assistant

## 2017-04-12 ENCOUNTER — Ambulatory Visit (INDEPENDENT_AMBULATORY_CARE_PROVIDER_SITE_OTHER): Payer: Managed Care, Other (non HMO) | Admitting: Physician Assistant

## 2017-04-12 VITALS — BP 136/89 | HR 60 | Temp 98.2°F | Resp 16 | Ht 69.5 in | Wt 173.0 lb

## 2017-04-12 DIAGNOSIS — H1033 Unspecified acute conjunctivitis, bilateral: Secondary | ICD-10-CM | POA: Diagnosis not present

## 2017-04-12 MED ORDER — ERYTHROMYCIN 5 MG/GM OP OINT: 1.0000 "application " | TOPICAL_OINTMENT | Freq: Four times a day (QID) | OPHTHALMIC | 0 refills | Status: DC

## 2017-04-12 NOTE — Progress Notes (Signed)
PRIMARY CARE AT [facility] [address] [place] [address] [phone]  Date:  04/12/2017   Name:  Chad Jacobs   DOB:  05-31-1965   MRN:  [id number]  PCP:  System, Pcp Not In    History of Present Illness:  Chad Jacobs is a 52 y.o. male patient who presents to PCP with  Chief Complaint  Patient presents with  . eyes infected     Patient has eye redness for the last couple of days.  He has noticed discharge and his eyes matted shut.  He has had no fever.  He denies nasal congestion, sore throat, coughing, or ear discomfort.  He has done nothing for his symptoms at this time.  He denies any trauma to the eyes.  He does not know anyone with pinkeye.  Patient Active Problem List   Diagnosis Date Noted  . Pure hypercholesterolemia 02/17/2014  . Diabetes (HCC) 09/28/2013  . Hypertension 03/04/2011    Past Medical History:  Diagnosis Date  . Hypertension     History reviewed. No pertinent surgical history.  Social History   Tobacco Use  . Smoking status: Never Smoker  . Smokeless tobacco: Never Used  Substance Use Topics  . Alcohol use: No  . Drug use: No    History reviewed. No pertinent family history.  No Known Allergies  Medication list has been reviewed and updated.  Current Outpatient Medications on File Prior to Visit  Medication Sig Dispense Refill  . atorvastatin (LIPITOR) 20 MG tablet Take 2 tablets (40 mg total) by mouth daily. 90 tablet 0  . lisinopril-hydrochlorothiazide (PRINZIDE,ZESTORETIC) 20-25 MG tablet Take 1 tablet by mouth daily. 90 tablet 1  . metFORMIN (GLUCOPHAGE) 500 MG tablet Take 1 tablet (500 mg total) by mouth 2 (two) times daily with a meal. 180 tablet 1   No current facility-administered medications on file prior to visit.     ROS ROS otherwise unremarkable unless listed above.  Physical Examination: BP 136/89 (BP Location: Right Arm, Patient Position: Sitting, Cuff Size: Normal)   Pulse 60   Temp 98.2 F (36.8 C)   Resp 16    Ht 5' 9.5" (1.765 m)   Wt 173 lb (78.5 kg)   SpO2 100%   BMI 25.18 kg/m  Ideal Body Weight: Weight in (lb) to have BMI = 25: 171.4  Physical Exam  Constitutional: He is oriented to person, place, and time. He appears well-developed and well-nourished. No distress.  HENT:  Head: Normocephalic and atraumatic.  Right Ear: Tympanic membrane, external ear and ear canal normal.  Left Ear: Tympanic membrane, external ear and ear canal normal.  Nose: No mucosal edema or rhinorrhea. Right sinus exhibits no maxillary sinus tenderness and no frontal sinus tenderness. Left sinus exhibits no maxillary sinus tenderness and no frontal sinus tenderness.  Mouth/Throat: No uvula swelling. No oropharyngeal exudate, posterior oropharyngeal edema or posterior oropharyngeal erythema.  Eyes: Pupils are equal, round, and reactive to light. EOM and lids are normal. Right conjunctiva is injected. Left conjunctiva is injected. Right eye exhibits normal extraocular motion. Left eye exhibits normal extraocular motion.  Discharge minimal at the eyelids.  Neck: Trachea normal and full passive range of motion without pain. No edema and no erythema present.  Cardiovascular: Normal rate.  Pulmonary/Chest: Effort normal. No respiratory distress. He has no decreased breath sounds. He has no wheezes. He has no rhonchi.  Neurological: He is alert and oriented to person, place, and time.  Skin: Skin is warm  and dry. He is not diaphoretic.  Psychiatric: He has a normal mood and affect. His behavior is normal.     Assessment and Plan: Chad Jacobs is a 52 y.o. male who is here today for cc of  Chief Complaint  Patient presents with  . eyes infected  advised to return if no improvement within the next 3 days.   Acute bacterial conjunctivitis of both eyes - Plan: erythromycin (ROMYCIN) ophthalmic ointment  Trena PlattStephanie English, PA-C [facility] 3/29/20198:58 AM

## 2017-04-12 NOTE — Patient Instructions (Addendum)
Please use the ointment up to 7 days.  If there are no improvement of your symptoms in 5 days, please return.  Please return for the warning signs below.    Bacterial Conjunctivitis Bacterial conjunctivitis is an infection of your conjunctiva. This is the clear membrane that covers the white part of your eye and the inner surface of your eyelid. This condition can make your eye:  Red or pink.  Itchy.  This condition is caused by bacteria. This condition spreads very easily from person to person (is contagious) and from one eye to the other eye. Follow these instructions at home: Medicines  Take or apply your antibiotic medicine as told by your doctor. Do not stop taking or applying the antibiotic even if you start to feel better.  Take or apply over-the-counter and prescription medicines only as told by your doctor.  Do not touch your eyelid with the eye drop bottle or the ointment tube. Managing discomfort  Wipe any fluid from your eye with a warm, wet washcloth or a cotton ball.  Place a cool, clean washcloth on your eye. Do this for 10-20 minutes, 3-4 times per day. General instructions  Do not wear contact lenses until the irritation is gone. Wear glasses until your doctor says it is okay to wear contacts.  Do not wear eye makeup until your symptoms are gone. Throw away any old makeup.  Change or wash your pillowcase every day.  Do not share towels or washcloths with anyone.  Wash your hands often with soap and water. Use paper towels to dry your hands.  Do not touch or rub your eyes.  Do not drive or use heavy machinery if your vision is blurry. Contact a doctor if:  You have a fever.  Your symptoms do not get better after 10 days. Get help right away if:  You have a fever and your symptoms suddenly get worse.  You have very bad pain when you move your eye.  Your face: ? Hurts. ? Is red. ? Is swollen.  You have sudden loss of vision. This information is not  intended to replace advice given to you by your health care provider. Make sure you discuss any questions you have with your health care provider. Document Released: 10/20/2007 Document Revised: 06/18/2015 Document Reviewed: 10/23/2014 Elsevier Interactive Patient Education  2018 [employer]     IF you received an x-ray today, you will receive an invoice from [facility]. Please contact [facility] at [phone] with questions or concerns regarding your invoice.   IF you received labwork today, you will receive an invoice from [facility]. Please contact [facility] at [phone] with questions or concerns regarding your invoice.   Our billing staff will not be able to assist you with questions regarding bills from these companies.  You will be contacted with the lab results as soon as they are available. The fastest way to get your results is to activate your My Chart account. Instructions are located on the last page of this paperwork. If you have not heard from us regarding the results in 2 weeks, please contact this office.

## 2017-04-15 ENCOUNTER — Emergency Department (HOSPITAL_COMMUNITY): Payer: PRIVATE HEALTH INSURANCE

## 2017-04-15 ENCOUNTER — Emergency Department (HOSPITAL_COMMUNITY)
Admission: EM | Admit: 2017-04-15 | Discharge: 2017-04-16 | Disposition: A | Discharge status: Home / Self Care | Payer: PRIVATE HEALTH INSURANCE | Attending: Emergency Medicine | Admitting: Emergency Medicine

## 2017-04-15 ENCOUNTER — Encounter (HOSPITAL_COMMUNITY): Payer: Self-pay | Admitting: Emergency Medicine

## 2017-04-15 DIAGNOSIS — Z79899 Other long term (current) drug therapy: Secondary | ICD-10-CM | POA: Insufficient documentation

## 2017-04-15 DIAGNOSIS — S0083XA Contusion of other part of head, initial encounter: Secondary | ICD-10-CM | POA: Diagnosis not present

## 2017-04-15 DIAGNOSIS — E119 Type 2 diabetes mellitus without complications: Secondary | ICD-10-CM | POA: Diagnosis not present

## 2017-04-15 DIAGNOSIS — Y92410 Unspecified street and highway as the place of occurrence of the external cause: Secondary | ICD-10-CM | POA: Insufficient documentation

## 2017-04-15 DIAGNOSIS — Y999 Unspecified external cause status: Secondary | ICD-10-CM | POA: Insufficient documentation

## 2017-04-15 DIAGNOSIS — F10920 Alcohol use, unspecified with intoxication, uncomplicated: Secondary | ICD-10-CM | POA: Insufficient documentation

## 2017-04-15 DIAGNOSIS — Z7984 Long term (current) use of oral hypoglycemic drugs: Secondary | ICD-10-CM | POA: Insufficient documentation

## 2017-04-15 DIAGNOSIS — I10 Essential (primary) hypertension: Secondary | ICD-10-CM | POA: Insufficient documentation

## 2017-04-15 DIAGNOSIS — S0990XA Unspecified injury of head, initial encounter: Secondary | ICD-10-CM | POA: Diagnosis present

## 2017-04-15 DIAGNOSIS — Y9389 Activity, other specified: Secondary | ICD-10-CM | POA: Diagnosis not present

## 2017-04-15 LAB — CBC
HEMATOCRIT: 39.7 % (ref 39.0–52.0)
Hemoglobin: 13.2 g/dL (ref 13.0–17.0)
MCH: 29.7 pg (ref 26.0–34.0)
MCHC: 33.2 g/dL (ref 30.0–36.0)
MCV: 89.2 fL (ref 78.0–100.0)
Platelets: 196 10*3/uL (ref 150–400)
RBC: 4.45 MIL/uL (ref 4.22–5.81)
RDW: 12.9 % (ref 11.5–15.5)
WBC: 8.2 10*3/uL (ref 4.0–10.5)

## 2017-04-15 LAB — COMPREHENSIVE METABOLIC PANEL
ALT: 31 U/L (ref 17–63)
AST: 45 U/L — AB (ref 15–41)
Albumin: 3.4 g/dL — ABNORMAL LOW (ref 3.5–5.0)
Alkaline Phosphatase: 97 U/L (ref 38–126)
Anion gap: 9 (ref 5–15)
BUN: 12 mg/dL (ref 6–20)
CHLORIDE: 105 mmol/L (ref 101–111)
CO2: 24 mmol/L (ref 22–32)
Calcium: 8.4 mg/dL — ABNORMAL LOW (ref 8.9–10.3)
Creatinine, Ser: 1.14 mg/dL (ref 0.61–1.24)
GFR calc Af Amer: >60 mL/min (ref >60)
GFR calc non Af Amer: >60 mL/min (ref >60)
GLUCOSE: 144 mg/dL — AB (ref 65–99)
POTASSIUM: 3.6 mmol/L (ref 3.5–5.1)
SODIUM: 138 mmol/L (ref 135–145)
Total Bilirubin: 0.6 mg/dL (ref 0.3–1.2)
Total Protein: 7.1 g/dL (ref 6.5–8.1)

## 2017-04-15 LAB — ETHANOL: Alcohol, Ethyl (B): 233 mg/dL — ABNORMAL HIGH (ref <10)

## 2017-04-15 MED ORDER — SODIUM CHLORIDE 0.9 % IV BOLUS (SEPSIS)
1000.0000 mL | Freq: Once | INTRAVENOUS | Status: AC
  Administered 2017-04-15: 1000 mL via INTRAVENOUS

## 2017-04-15 MED ORDER — SODIUM CHLORIDE 0.9 % IV SOLN
INTRAVENOUS | Status: DC
  Administered 2017-04-15: 23:00:00 via INTRAVENOUS
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
## 2017-04-15 NOTE — Discharge Instructions (Addendum)
Return if worse

## 2017-04-16 DIAGNOSIS — S0083XA Contusion of other part of head, initial encounter: Secondary | ICD-10-CM | POA: Diagnosis not present

## 2017-04-16 MED ORDER — HYDROCODONE-ACETAMINOPHEN 5-325 MG PO TABS
1.0000 | ORAL_TABLET | Freq: Once | ORAL | Status: AC
  Administered 2017-04-16: 1 via ORAL
  Filled 2017-04-16: qty 1

## 2017-04-16 MED ORDER — IBUPROFEN 400 MG PO TABS
600.0000 mg | ORAL_TABLET | Freq: Once | ORAL | Status: AC
  Administered 2017-04-16: 600 mg via ORAL
  Filled 2017-04-16: qty 1

## 2017-04-21 ENCOUNTER — Encounter: Payer: Self-pay | Admitting: Physician Assistant

## 2017-04-24 ENCOUNTER — Encounter: Payer: Self-pay | Admitting: Physician Assistant

## 2017-05-01 ENCOUNTER — Ambulatory Visit: Payer: Managed Care, Other (non HMO) | Admitting: Family Medicine

## 2017-05-02 ENCOUNTER — Ambulatory Visit (INDEPENDENT_AMBULATORY_CARE_PROVIDER_SITE_OTHER): Payer: Managed Care, Other (non HMO) | Admitting: Emergency Medicine

## 2017-05-02 ENCOUNTER — Encounter: Payer: Self-pay | Admitting: Emergency Medicine

## 2017-05-02 ENCOUNTER — Ambulatory Visit (INDEPENDENT_AMBULATORY_CARE_PROVIDER_SITE_OTHER): Payer: Managed Care, Other (non HMO)

## 2017-05-02 ENCOUNTER — Other Ambulatory Visit: Payer: Self-pay

## 2017-05-02 VITALS — BP 144/88 | HR 70 | Temp 98.3°F | Resp 16 | Ht 68.0 in | Wt 169.4 lb

## 2017-05-02 DIAGNOSIS — R0781 Pleurodynia: Secondary | ICD-10-CM

## 2017-05-02 DIAGNOSIS — S40011S Contusion of right shoulder, sequela: Secondary | ICD-10-CM | POA: Diagnosis not present

## 2017-05-02 DIAGNOSIS — M25511 Pain in right shoulder: Secondary | ICD-10-CM

## 2017-05-02 DIAGNOSIS — S2241XA Multiple fractures of ribs, right side, initial encounter for closed fracture: Secondary | ICD-10-CM

## 2017-05-02 DIAGNOSIS — R0789 Other chest pain: Secondary | ICD-10-CM

## 2017-05-02 DIAGNOSIS — S40011A Contusion of right shoulder, initial encounter: Secondary | ICD-10-CM | POA: Insufficient documentation

## 2017-05-02 MED ORDER — DICLOFENAC SODIUM 75 MG PO TBEC: 75.0000 mg | DELAYED_RELEASE_TABLET | Freq: Two times a day (BID) | ORAL | 0 refills | Status: AC
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
## 2017-05-02 NOTE — Patient Instructions (Addendum)
     IF you received an x-ray today, you will receive an invoice from [facility]. Please contact  Radiology at [phone] with questions or concerns regarding your invoice.   IF you received labwork today, you will receive an invoice from [facility]. Please contact [facility] at [phone] with questions or concerns regarding your invoice.   Our billing staff will not be able to assist you with questions regarding bills from these companies.  You will be contacted with the lab results as soon as they are available. The fastest way to get your results is to activate your My Chart account. Instructions are located on the last page of this paperwork. If you have not heard from us regarding the results in 2 weeks, please contact this office.     Rib Fracture A rib fracture is a break or crack in one of the bones of the ribs. The ribs are like a cage that goes around your upper chest. A broken or cracked rib is often painful, but most do not cause other problems. Most rib fractures heal on their own in 1-3 months. Follow these instructions at home:  Avoid activities that cause pain to the injured area. Protect your injured area.  Slowly increase activity as told by your doctor.  Take medicine as told by your doctor.  Put ice on the injured area for the first 1-2 days after you have been treated or as told by your doctor. ? Put ice in a plastic bag. ? Place a towel between your skin and the bag. ? Leave the ice on for 15-20 minutes at a time, every 2 hours while you are awake.  Do deep breathing as told by your doctor. You may be told to: ? Take deep breaths many times a day. ? Cough many times a day while hugging a pillow. ? Use a device (incentive spirometer) to perform deep breathing many times a day.  Drink enough fluids to keep your pee (urine) clear or pale yellow.  Do not wear a rib belt or binder. These do not allow you to breathe deeply. Get help right away  if:  You have a fever.  You have trouble breathing.  You cannot stop coughing.  You cough up thick or bloody spit (mucus).  You feel sick to your stomach (nauseous), throw up (vomit), or have belly (abdominal) pain.  Your pain gets worse and medicine does not help. This information is not intended to replace advice given to you by your health care provider. Make sure you discuss any questions you have with your health care provider. Document Released: 10/20/2007 Document Revised: 06/18/2015 Document Reviewed: 03/14/2012 Elsevier Interactive Patient Education  2018 Elsevier Inc.  

## 2017-05-16 ENCOUNTER — Ambulatory Visit (INDEPENDENT_AMBULATORY_CARE_PROVIDER_SITE_OTHER): Payer: Managed Care, Other (non HMO) | Admitting: Emergency Medicine

## 2017-05-16 ENCOUNTER — Encounter: Payer: Self-pay | Admitting: Emergency Medicine

## 2017-05-16 VITALS — BP 122/72 | HR 67 | Temp 98.1°F | Resp 17 | Ht 68.0 in | Wt 175.0 lb

## 2017-05-16 DIAGNOSIS — S2241XD Multiple fractures of ribs, right side, subsequent encounter for fracture with routine healing: Secondary | ICD-10-CM | POA: Diagnosis not present

## 2017-05-16 NOTE — Patient Instructions (Addendum)
     IF you received an x-ray today, you will receive an invoice from [facility]. Please contact [facility] at [phone] with questions or concerns regarding your invoice.   IF you received labwork today, you will receive an invoice from [facility]. Please contact [facility] at [phone] with questions or concerns regarding your invoice.   Our billing staff will not be able to assist you with questions regarding bills from these companies.  You will be contacted with the lab results as soon as they are available. The fastest way to get your results is to activate your My Chart account. Instructions are located on the last page of this paperwork. If you have not heard from us regarding the results in 2 weeks, please contact this office.     Rib Fracture A rib fracture is a break or crack in one of the bones of the ribs. The ribs are like a cage that goes around your upper chest. A broken or cracked rib is often painful, but most do not cause other problems. Most rib fractures heal on their own in 1-3 months. Follow these instructions at home:  Avoid activities that cause pain to the injured area. Protect your injured area.  Slowly increase activity as told by your doctor.  Take medicine as told by your doctor.  Put ice on the injured area for the first 1-2 days after you have been treated or as told by your doctor. ? Put ice in a plastic bag. ? Place a towel between your skin and the bag. ? Leave the ice on for 15-20 minutes at a time, every 2 hours while you are awake.  Do deep breathing as told by your doctor. You may be told to: ? Take deep breaths many times a day. ? Cough many times a day while hugging a pillow. ? Use a device (incentive spirometer) to perform deep breathing many times a day.  Drink enough fluids to keep your pee (urine) clear or pale yellow.  Do not wear a rib belt or binder. These do not allow you to breathe deeply. Get help right away  if:  You have a fever.  You have trouble breathing.  You cannot stop coughing.  You cough up thick or bloody spit (mucus).  You feel sick to your stomach (nauseous), throw up (vomit), or have belly (abdominal) pain.  Your pain gets worse and medicine does not help. This information is not intended to replace advice given to you by your health care provider. Make sure you discuss any questions you have with your health care provider. Document Released: 10/20/2007 Document Revised: 06/18/2015 Document Reviewed: 03/14/2012 Elsevier Interactive Patient Education  2018 Elsevier Inc.  
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
## 2017-05-16 NOTE — Assessment & Plan Note (Signed)
Doing well.  Ribs healing well.  No new complaints or concerns.  We will continue present treatment.  Follow-up in 2 weeks.

## 2018-02-09 ENCOUNTER — Encounter: Payer: Self-pay | Admitting: Family Medicine

## 2018-02-09 ENCOUNTER — Ambulatory Visit (INDEPENDENT_AMBULATORY_CARE_PROVIDER_SITE_OTHER): Payer: Managed Care, Other (non HMO) | Admitting: Family Medicine

## 2018-02-09 ENCOUNTER — Other Ambulatory Visit: Payer: Self-pay

## 2018-02-09 VITALS — BP 147/78 | HR 65 | Temp 98.0°F | Resp 18 | Ht 69.13 in | Wt 176.4 lb

## 2018-02-09 DIAGNOSIS — M545 Low back pain, unspecified: Secondary | ICD-10-CM

## 2018-02-09 DIAGNOSIS — R030 Elevated blood-pressure reading, without diagnosis of hypertension: Secondary | ICD-10-CM

## 2018-02-09 MED ORDER — CYCLOBENZAPRINE HCL 5 MG PO TABS: 2.5000 mg | ORAL_TABLET | Freq: Every evening | ORAL | 0 refills | Status: DC | PRN

## 2018-02-09 NOTE — Patient Instructions (Addendum)
See info on back pain. Stop alleve for now. Tylenol over-the-counter as needed, I did write for a few muscle relaxants.  Try taking one half at bedtime as needed initially, up to 1 pill if needed.  That can cause sedation and dizziness, so be careful.  Please follow-up in the next 2 to 3 weeks to recheck back as well as blood pressure at that time.  Thank you for coming in today.  Return to the clinic or go to the nearest emergency room if any of your symptoms worsen or new symptoms occur.   Acute Back Pain, Adult Acute back pain is sudden and usually short-lived. It is often caused by an injury to the muscles and tissues in the back. The injury may result from:  A muscle or ligament getting overstretched or torn (strained). Ligaments are tissues that connect bones to each other. Lifting something improperly can cause a back strain.  Wear and tear (degeneration) of the spinal disks. Spinal disks are circular tissue that provides cushioning between the bones of the spine (vertebrae).  Twisting motions, such as while playing sports or doing yard work.  A hit to the back.  Arthritis. You may have a physical exam, lab tests, and imaging tests to find the cause of your pain. Acute back pain usually goes away with rest and home care. Follow these instructions at home: Managing pain, stiffness, and swelling  Take over-the-counter and prescription medicines only as told by your health care provider.  Your health care provider may recommend applying ice during the first 24-48 hours after your pain starts. To do this: ? Put ice in a plastic bag. ? Place a towel between your skin and the bag. ? Leave the ice on for 20 minutes, 2-3 times a day.  If directed, apply heat to the affected area as often as told by your health care provider. Use the heat source that your health care provider recommends, such as a moist heat pack or a heating pad. ? Place a towel between your skin and the heat  source. ? Leave the heat on for 20-30 minutes. ? Remove the heat if your skin turns bright red. This is especially important if you are unable to feel pain, heat, or cold. You have a greater risk of getting burned. Activity   Do not stay in bed. Staying in bed for more than 1-2 days can delay your recovery.  Sit up and stand up straight. Avoid leaning forward when you sit, or hunching over when you stand. ? If you work at a desk, sit close to it so you do not need to lean over. Keep your chin tucked in. Keep your neck drawn back, and keep your elbows bent at a right angle. Your arms should look like the letter "L." ? Sit high and close to the steering wheel when you drive. Add lower back (lumbar) support to your car seat, if needed.  Take short walks on even surfaces as soon as you are able. Try to increase the length of time you walk each day.  Do not sit, drive, or stand in one place for more than 30 minutes at a time. Sitting or standing for long periods of time can put stress on your back.  Do not drive or use heavy machinery while taking prescription pain medicine.  Use proper lifting techniques. When you bend and lift, use positions that put less stress on your back: ? [place] your knees. ? Keep the load close  to your body. ? Avoid twisting.  Exercise regularly as told by your health care provider. Exercising helps your back heal faster and helps prevent back injuries by keeping muscles strong and flexible.  Work with a physical therapist to make a safe exercise program, as recommended by your health care provider. Do any exercises as told by your physical therapist. Lifestyle  Maintain a healthy weight. Extra weight puts stress on your back and makes it difficult to have good posture.  Avoid activities or situations that make you feel anxious or stressed. Stress and anxiety increase muscle tension and can make back pain worse. Learn ways to manage anxiety and stress, such as through  exercise. General instructions  Sleep on a firm mattress in a comfortable position. Try lying on your side with your knees slightly bent. If you lie on your back, put a pillow under your knees.  Follow your treatment plan as told by your health care provider. This may include: ? Cognitive or behavioral therapy. ? Acupuncture or massage therapy. ? Meditation or yoga. Contact a health care provider if:  You have pain that is not relieved with rest or medicine.  You have increasing pain going down into your legs or buttocks.  Your pain does not improve after 2 weeks.  You have pain at night.  You lose weight without trying.  You have a fever or chills. Get help right away if:  You develop new bowel or bladder control problems.  You have unusual weakness or numbness in your arms or legs.  You develop nausea or vomiting.  You develop abdominal pain.  You feel faint. Summary  Acute back pain is sudden and usually short-lived.  Use proper lifting techniques. When you bend and lift, use positions that put less stress on your back.  Take over-the-counter and prescription medicines and apply heat or ice as directed by your health care provider. This information is not intended to replace advice given to you by your health care provider. Make sure you discuss any questions you have with your health care provider. Document Released: 01/10/2005 Document Revised: 08/17/2017 Document Reviewed: 08/24/2016 Elsevier Interactive Patient Education  [employer]2019 [employer]   If you have lab work done today you will be contacted with your lab results within the next 2 weeks.  If you have not heard from us then please contact us. The fastest way to get your results is to register for My Chart.   IF you received an x-ray today, you will receive an invoice from [facility]. Please contact [facility] at [phone] with questions or concerns regarding your invoice.   IF you  received labwork today, you will receive an invoice from [facility]. Please contact [facility] at [phone] with questions or concerns regarding your invoice.   Our billing staff will not be able to assist you with questions regarding bills from these companies.  You will be contacted with the lab results as soon as they are available. The fastest way to get your results is to activate your My Chart account. Instructions are located on the last page of this paperwork. If you have not heard from us regarding the results in 2 weeks, please contact this office.

## 2018-02-09 NOTE — Progress Notes (Addendum)
Subjective:    Patient ID: Chad Jacobs, male    DOB: April 26, 1965, 53 y.o.   MRN: [id number]  Chief Complaint  Patient presents with  . Back Pain    X 3 days    HPI Chad Jacobs is a 53 y.o. male who presents to Primary Care at [facility] complaining of back pain.  Hx of Diabetes and HTN  1. Lower back pain  For the last three days, right and left low back has been in pain.   The pain is worse with cold weather. He has not fallen, twisted, or changed his activity prior to pain.   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   The pain is better with standing, worse with sitting. No urinary or stool incontinence.   No saddle anesthesia   No lower extremity weakness or radiation of pain.   Afebrile.   There is no unexplained weight loss.   There is no night sweats.   Locates area of pain to the lumbar spine   He took over the counter two Naproson 220mg  - moderate relief.    Review of Systems Per HPI.     Objective:   Physical Exam Constitutional:      General: He is not in acute distress.    Appearance: He is well-developed.  HENT:     Head: Normocephalic and atraumatic.  Cardiovascular:     Rate and Rhythm: Normal rate.  Pulmonary:     Effort: Pulmonary effort is normal.  Musculoskeletal:     Right shoulder: He exhibits no tenderness.  Neurological:     Mental Status: He is alert and oriented to person, place, and time.     Deep Tendon Reflexes:     Reflex Scores:      Patellar reflexes are 2+ on the right side and 2+ on the left side.      Achilles reflexes are 2+ on the right side and 2+ on the left side.    Comments: No difficult walking on heels or toes. Lumbar spine full range of motion. Negative leg raise.    No midline bony tenderness of lumbar spine, slight tenderness to paraspinal muscles lower paraspinal, minimal spasm.  Vitals:   02/09/18 1040 02/09/18 1048  BP: (!) 156/83 (!)  147/78  Pulse: 65   Resp: 18   Temp: 98 F (36.7 C)   TempSrc: Oral   SpO2: 100%   Weight: 176 lb 6.4 oz (80 kg)   Height: 5' 9.13" (1.756 m)        Assessment & Plan:    Chad Jacobs is a 53 y.o. male Acute bilateral low back pain without sciatica - Plan: cyclobenzaprine (FLEXERIL) 5 MG tablet  Elevated blood pressure reading  Possible overuse/degenerative changes with flare.  Based on exam and timing of symptoms decided against imaging at this time.  No red flags on exam or history.  -Symptomatic care discussed with handout given, low-dose Flexeril prescribed with potential side effects and risk discussed, use lowest effective dose.  Stop NSAIDs given hypertension and diabetes, Tylenol over-the-counter if needed.  Recheck next few weeks to review back pain and blood pressure further at that time.  RTC/ER precautions if worse.   Meds ordered this encounter  Medications  . cyclobenzaprine (FLEXERIL) 5 MG tablet    Sig: Take 0.5-1 tablets (2.5-5 mg total) by mouth at bedtime as needed.  Dispense:  15 tablet    Refill:  0   Patient Instructions   See info on back pain. Stop alleve for now. Tylenol over-the-counter as needed, I did write for a few muscle relaxants.  Try taking one half at bedtime as needed initially, up to 1 pill if needed.  That can cause sedation and dizziness, so be careful.  Please follow-up in the next 2 to 3 weeks to recheck back as well as blood pressure at that time.  Thank you for coming in today.  Return to the clinic or go to the nearest emergency room if any of your symptoms worsen or new symptoms occur.   Acute Back Pain, Adult Acute back pain is sudden and usually short-lived. It is often caused by an injury to the muscles and tissues in the back. The injury may result from:  A muscle or ligament getting overstretched or torn (strained). Ligaments are tissues that connect bones to each other. Lifting something improperly can cause a back  strain.  Wear and tear (degeneration) of the spinal disks. Spinal disks are circular tissue that provides cushioning between the bones of the spine (vertebrae).  Twisting motions, such as while playing sports or doing yard work.  A hit to the back.  Arthritis. You may have a physical exam, lab tests, and imaging tests to find the cause of your pain. Acute back pain usually goes away with rest and home care. Follow these instructions at home: Managing pain, stiffness, and swelling  Take over-the-counter and prescription medicines only as told by your health care provider.  Your health care provider may recommend applying ice during the first 24-48 hours after your pain starts. To do this: ? Put ice in a plastic bag. ? Place a towel between your skin and the bag. ? Leave the ice on for 20 minutes, 2-3 times a day.  If directed, apply heat to the affected area as often as told by your health care provider. Use the heat source that your health care provider recommends, such as a moist heat pack or a heating pad. ? Place a towel between your skin and the heat source. ? Leave the heat on for 20-30 minutes. ? Remove the heat if your skin turns bright red. This is especially important if you are unable to feel pain, heat, or cold. You have a greater risk of getting burned. Activity   Do not stay in bed. Staying in bed for more than 1-2 days can delay your recovery.  Sit up and stand up straight. Avoid leaning forward when you sit, or hunching over when you stand. ? If you work at a desk, sit close to it so you do not need to lean over. Keep your chin tucked in. Keep your neck drawn back, and keep your elbows bent at a right angle. Your arms should look like the letter "L." ? Sit high and close to the steering wheel when you drive. Add lower back (lumbar) support to your car seat, if needed.  Take short walks on even surfaces as soon as you are able. Try to increase the length of time you  walk each day.  Do not sit, drive, or stand in one place for more than 30 minutes at a time. Sitting or standing for long periods of time can put stress on your back.  Do not drive or use heavy machinery while taking prescription pain medicine.  Use proper lifting techniques. When you bend and lift, use  positions that put less stress on your back: ? [place] your knees. ? Keep the load close to your body. ? Avoid twisting.  Exercise regularly as told by your health care provider. Exercising helps your back heal faster and helps prevent back injuries by keeping muscles strong and flexible.  Work with a physical therapist to make a safe exercise program, as recommended by your health care provider. Do any exercises as told by your physical therapist. Lifestyle  Maintain a healthy weight. Extra weight puts stress on your back and makes it difficult to have good posture.  Avoid activities or situations that make you feel anxious or stressed. Stress and anxiety increase muscle tension and can make back pain worse. Learn ways to manage anxiety and stress, such as through exercise. General instructions  Sleep on a firm mattress in a comfortable position. Try lying on your side with your knees slightly bent. If you lie on your back, put a pillow under your knees.  Follow your treatment plan as told by your health care provider. This may include: ? Cognitive or behavioral therapy. ? Acupuncture or massage therapy. ? Meditation or yoga. Contact a health care provider if:  You have pain that is not relieved with rest or medicine.  You have increasing pain going down into your legs or buttocks.  Your pain does not improve after 2 weeks.  You have pain at night.  You lose weight without trying.  You have a fever or chills. Get help right away if:  You develop new bowel or bladder control problems.  You have unusual weakness or numbness in your arms or legs.  You develop nausea or  vomiting.  You develop abdominal pain.  You feel faint. Summary  Acute back pain is sudden and usually short-lived.  Use proper lifting techniques. When you bend and lift, use positions that put less stress on your back.  Take over-the-counter and prescription medicines and apply heat or ice as directed by your health care provider. This information is not intended to replace advice given to you by your health care provider. Make sure you discuss any questions you have with your health care provider. Document Released: 01/10/2005 Document Revised: 08/17/2017 Document Reviewed: 08/24/2016 Elsevier Interactive Patient Education  [employer].   If you have lab work done today you will be contacted with your lab results within the next 2 weeks.  If you have not heard from [place] then please contact us. The fastest way to get your results is to register for My Chart.   IF you received an x-ray today, you will receive an invoice from [facility]. Please contact [facility] at [phone] with questions or concerns regarding your invoice.   IF you received labwork today, you will receive an invoice from [place]. Please contact [facility] at [phone] with questions or concerns regarding your invoice.   Our billing staff will not be able to assist you with questions regarding bills from these companies.  You will be contacted with the lab results as soon as they are available. The fastest way to get your results is to activate your My Chart account. Instructions are located on the last page of this paperwork. If you have not heard from [place] regarding the results in 2 weeks, please contact this office.      I personally performed the services described in this documentation, which was scribed in my presence. The recorded information has been reviewed and considered for accuracy and completeness, addended  by me as needed, and agree with information above.  Signed,    Meredith StaggersJeffrey Wynn Kernes, MD Primary Care at [facility].  02/09/18 11:57 AM    I, Doneta PublicAmber N Handy, acting as a scribe for Shade FloodJeffrey R Omni Dunsworth, MD, have documented all relevant documentation on the behalf of Shade FloodJeffrey R Hisashi Amadon, MD, as directed by Shade FloodJeffrey R Porscha Axley, MD while in the presence of Shade FloodJeffrey R Taliesin Hartlage, MD.

## 2018-02-16 ENCOUNTER — Other Ambulatory Visit: Payer: Self-pay | Admitting: Physician Assistant

## 2018-02-16 DIAGNOSIS — E119 Type 2 diabetes mellitus without complications: Secondary | ICD-10-CM

## 2018-02-16 DIAGNOSIS — I10 Essential (primary) hypertension: Secondary | ICD-10-CM

## 2018-03-09 ENCOUNTER — Ambulatory Visit (INDEPENDENT_AMBULATORY_CARE_PROVIDER_SITE_OTHER): Payer: Managed Care, Other (non HMO) | Admitting: Family Medicine

## 2018-03-09 ENCOUNTER — Other Ambulatory Visit: Payer: Self-pay

## 2018-03-09 ENCOUNTER — Encounter: Payer: Self-pay | Admitting: Family Medicine

## 2018-03-09 VITALS — BP 116/74 | HR 77 | Temp 98.3°F | Ht 69.0 in | Wt 175.0 lb

## 2018-03-09 DIAGNOSIS — E785 Hyperlipidemia, unspecified: Secondary | ICD-10-CM | POA: Diagnosis not present

## 2018-03-09 DIAGNOSIS — E119 Type 2 diabetes mellitus without complications: Secondary | ICD-10-CM

## 2018-03-09 DIAGNOSIS — I10 Essential (primary) hypertension: Secondary | ICD-10-CM | POA: Diagnosis not present

## 2018-03-09 DIAGNOSIS — M79652 Pain in left thigh: Secondary | ICD-10-CM

## 2018-03-09 NOTE — Patient Instructions (Addendum)
  Occasional tylenol if needed for thigh pain, stretches of back and legs as needed. If pain is not improving in next 2 weeks, recheck. Return to the clinic or go to the nearest emergency room if any of your symptoms worsen or new symptoms occur.  I will check some blood tests today. recheck in 1 month to review labs and discuss diabetes further.   If you have lab work done today you will be contacted with your lab results within the next 2 weeks.  If you have not heard from [place] then please contact us. The fastest way to get your results is to register for My Chart.   IF you received an x-ray today, you will receive an invoice from [facility]. Please contact [facility] at [phone] with questions or concerns regarding your invoice.   IF you received labwork today, you will receive an invoice from [place]. Please contact [facility] at [phone] with questions or concerns regarding your invoice.   Our billing staff will not be able to assist you with questions regarding bills from these companies.  You will be contacted with the lab results as soon as they are available. The fastest way to get your results is to activate your My Chart account. Instructions are located on the last page of this paperwork. If you have not heard from [place] regarding the results in 2 weeks, please contact this office.

## 2018-03-09 NOTE — Progress Notes (Signed)
Subjective:    Patient ID: Chad Jacobs, male    DOB: Jun 20, 1965, 53 y.o.   MRN: [id number]  HPI Chad Jacobs is a 54 y.o. male Presents today for: Chief Complaint  Patient presents with  . Hypertension    f/u   . left thigh pain    off and on when he started taking flexeral    Here for medication refill/follow-up.  I saw him for acute visit January 17 for back pain.  Suspected overuse/degenerative changes with flare, treated with Tylenol over-the-counter.  Flexeril if needed. Back pain is better.  Took flexeril once per day.  Noticed L thigh sore with bending/squatting for 5 days. Comes and goes.  No leg weakness.  Only left thigh. NKI No recent statin.   Hypertension: BP Readings from Last 3 Encounters:  03/09/18 116/74  02/09/18 (!) 147/78  05/16/17 122/72   Lab Results  Component Value Date   CREATININE 1.14 04/15/2017   on lisinopril/hct qd.  No new side effects.  Normal blood pressures outside of office.   Diabetes:  Lab Results  Component Value Date   HGBA1C 6.6 04/10/2015   HGBA1C 7.0 02/17/2014   HGBA1C 6.6 09/28/2013   Lab Results  Component Value Date   MICROALBUR 0.50 10/20/2012   LDLCALC 146 (H) 04/10/2015   CREATININE 1.14 04/15/2017  no recent follow up for DM.  Metformin BID. No new side effects.  No recent optho.   Hyperlipidemia:  Lab Results  Component Value Date   CHOL 227 (H) 04/10/2015   HDL 63 04/10/2015   LDLCALC 146 (H) 04/10/2015   TRIG 91 04/10/2015   CHOLHDL 3.6 04/10/2015   Lab Results  Component Value Date   ALT 31 04/15/2017   AST 45 (H) 04/15/2017   ALKPHOS 97 04/15/2017   BILITOT 0.6 04/15/2017  no recent eval/testing for cholesterol.  No recent statin.     Review of Systems  Constitutional: Negative for fatigue and unexpected weight change.  Eyes: Negative for visual disturbance.  Respiratory: Negative for cough, chest tightness and shortness of breath.   Cardiovascular: Negative for chest pain,  palpitations and leg swelling.  Gastrointestinal: Negative for abdominal pain and blood in stool.  Neurological: Negative for dizziness, light-headedness and headaches.   Other per HPI.       Objective:   Physical Exam Vitals signs reviewed.  Constitutional:      Appearance: He is well-developed.  HENT:     Head: Normocephalic and atraumatic.  Eyes:     Pupils: Pupils are equal, round, and reactive to light.  Neck:     Vascular: No carotid bruit or JVD.  Cardiovascular:     Rate and Rhythm: Normal rate and regular rhythm.     Heart sounds: Normal heart sounds. No murmur.  Pulmonary:     Effort: Pulmonary effort is normal.     Breath sounds: Normal breath sounds. No rales.  Musculoskeletal:     Comments: Pain-free range of motion of left hip and left knee.  Reports area of discomfort at the lateral left thigh but no apparent tissue swelling, soft musculature.  No rash.  Skin:    General: Skin is warm and dry.  Neurological:     Mental Status: He is alert and oriented to person, place, and time.       Assessment & Plan:  Chad Jacobs is a 53 y.o. male Type 2 diabetes mellitus without complication, without long-term current use of insulin (HCC) -  Plan: Microalbumin / creatinine urine ratio, Hemoglobin A1c  -Check labs, continue same dose metformin for now.  Plan to recheck in 1 month to discuss labs and plan further.  Essential hypertension - Plan: Comprehensive metabolic panel  -Stable, no med changes.  Check electrolytes  Left thigh pain - Plan: CK  -Check CPK, but not currently on statin.  Would want to make sure that is normal before we start statin which will likely be the case with history of diabetes.  RTC precautions if not improving with range of motion and stretches, occasional Tylenol as needed.  Hyperlipidemia, unspecified hyperlipidemia type - Plan: Lipid panel  -Check lipids, consider statin given diabetes history.  No orders of the defined types were  placed in this encounter.  Patient Instructions    Occasional tylenol if needed for thigh pain, stretches of back and legs as needed. If pain is not improving in next 2 weeks, recheck. Return to the clinic or go to the nearest emergency room if any of your symptoms worsen or new symptoms occur.  I will check some blood tests today. recheck in 1 month to review labs and discuss diabetes further.   If you have lab work done today you will be contacted with your lab results within the next 2 weeks.  If you have not heard from [place] then please contact us. The fastest way to get your results is to register for My Chart.   IF you received an x-ray today, you will receive an invoice from [facility]. Please contact [facility] at [phone] with questions or concerns regarding your invoice.   IF you received labwork today, you will receive an invoice from [place]. Please contact [facility] at [phone] with questions or concerns regarding your invoice.   Our billing staff will not be able to assist you with questions regarding bills from these companies.  You will be contacted with the lab results as soon as they are available. The fastest way to get your results is to activate your My Chart account. Instructions are located on the last page of this paperwork. If you have not heard from [place] regarding the results in 2 weeks, please contact this office.       Signed,   Meredith Staggers, MD Primary Care at [facility].  03/10/18 1:56 PM

## 2018-03-10 LAB — HEMOGLOBIN A1C
ESTIMATED AVERAGE GLUCOSE: 154 mg/dL
Hgb A1c MFr Bld: 7 % — ABNORMAL HIGH (ref 4.8–5.6)

## 2018-03-10 LAB — COMPREHENSIVE METABOLIC PANEL
A/G RATIO: 1.4 (ref 1.2–2.2)
ALT: 28 IU/L (ref 0–44)
AST: 24 IU/L (ref 0–40)
Albumin: 4.5 g/dL (ref 3.8–4.9)
Alkaline Phosphatase: 109 IU/L (ref 39–117)
BUN/Creatinine Ratio: 14 (ref 9–20)
BUN: 14 mg/dL (ref 6–24)
Bilirubin Total: 0.3 mg/dL (ref 0.0–1.2)
CO2: 25 mmol/L (ref 20–29)
Calcium: 9.4 mg/dL (ref 8.7–10.2)
Chloride: 98 mmol/L (ref 96–106)
Creatinine, Ser: 0.99 mg/dL (ref 0.76–1.27)
GFR calc Af Amer: 99 mL/min/{1.73_m2} (ref >59)
GFR calc non Af Amer: 85 mL/min/{1.73_m2} (ref >59)
Globulin, Total: 3.2 g/dL (ref 1.5–4.5)
Glucose: 164 mg/dL — ABNORMAL HIGH (ref 65–99)
POTASSIUM: 4.4 mmol/L (ref 3.5–5.2)
Sodium: 137 mmol/L (ref 134–144)
Total Protein: 7.7 g/dL (ref 6.0–8.5)

## 2018-03-10 LAB — LIPID PANEL
CHOL/HDL RATIO: 3.8 ratio (ref 0.0–5.0)
Cholesterol, Total: 227 mg/dL — ABNORMAL HIGH (ref 100–199)
HDL: 59 mg/dL (ref >39)
LDL Calculated: 146 mg/dL — ABNORMAL HIGH (ref 0–99)
Triglycerides: 109 mg/dL (ref 0–149)
VLDL CHOLESTEROL CAL: 22 mg/dL (ref 5–40)

## 2018-03-10 LAB — MICROALBUMIN / CREATININE URINE RATIO
Creatinine, Urine: 215.5 mg/dL
MICROALB/CREAT RATIO: 3 mg/g{creat} (ref 0–29)
Microalbumin, Urine: 5.9 ug/mL

## 2018-03-10 LAB — CK: Total CK: 292 U/L — ABNORMAL HIGH (ref 24–204)

## 2018-03-20 ENCOUNTER — Encounter: Payer: Self-pay | Admitting: Radiology

## 2018-06-12 ENCOUNTER — Other Ambulatory Visit: Payer: Self-pay | Admitting: Family Medicine

## 2018-06-12 DIAGNOSIS — E119 Type 2 diabetes mellitus without complications: Secondary | ICD-10-CM

## 2018-06-12 DIAGNOSIS — I10 Essential (primary) hypertension: Secondary | ICD-10-CM

## 2018-06-12 NOTE — Telephone Encounter (Signed)
Requested Prescriptions  Pending Prescriptions Disp Refills  . lisinopril-hydrochlorothiazide (ZESTORETIC) 20-25 MG tablet [Pharmacy Med Name: LISINOPRIL-HCTZ 20/25MG TABLETS] 90 tablet 0    Sig: TAKE 1 TABLET BY MOUTH DAILY     Cardiovascular:  ACEI + Diuretic Combos Passed - 06/12/2018 12:53 PM      Passed - Na in normal range and within 180 days    Sodium  Date Value Ref Range Status  03/09/2018 137 134 - 144 mmol/L Final         Passed - K in normal range and within 180 days    Potassium  Date Value Ref Range Status  03/09/2018 4.4 3.5 - 5.2 mmol/L Final         Passed - Cr in normal range and within 180 days    Creat  Date Value Ref Range Status  04/10/2015 0.77 0.70 - 1.33 mg/dL Final   Creatinine, Ser  Date Value Ref Range Status  03/09/2018 0.99 0.76 - 1.27 mg/dL Final         Passed - Ca in normal range and within 180 days    Calcium  Date Value Ref Range Status  03/09/2018 9.4 8.7 - 10.2 mg/dL Final         Passed - Patient is not pregnant      Passed - Last BP in normal range    BP Readings from Last 1 Encounters:  03/09/18 116/74         Passed - Valid encounter within last 6 months    Recent Outpatient Visits          3 months ago Type 2 diabetes mellitus without complication, without long-term current use of insulin ([place])   [facility] at [facility], Ranell Patrick, MD   4 months ago Acute bilateral low back pain without sciatica   [facility], Ranell Patrick, MD   1 year ago Closed fracture of multiple ribs of right side with routine healing, subsequent encounter   Primary Care at [facility], Ines Bloomer, MD   1 year ago Acute pain of right shoulder   Primary Care at [facility], [place], [place]   1 year ago Acute bacterial conjunctivitis of both eyes   Primary [facility] at [place] [facility], Tarkio D, [place]           . metFORMIN (GLUCOPHAGE) 500 MG tablet [Pharmacy Med Name: METFORMIN 500MG TABLETS] 180 tablet 0    Sig: TAKE 1  TABLET(500 MG) BY MOUTH TWICE DAILY WITH A MEAL     Endocrinology:  Diabetes - Biguanides Passed - 06/12/2018 12:53 PM      Passed - Cr in normal range and within 360 days    Creat  Date Value Ref Range Status  04/10/2015 0.77 0.70 - 1.33 mg/dL Final   Creatinine, Ser  Date Value Ref Range Status  03/09/2018 0.99 0.76 - 1.27 mg/dL Final         Passed - HBA1C is between 0 and 7.9 and within 180 days    Hgb A1c MFr Bld  Date Value Ref Range Status  03/09/2018 7.0 (H) 4.8 - 5.6 % Final    Comment:             Prediabetes: 5.7 - 6.4          Diabetes: >6.4          Glycemic control for adults with diabetes: <7.0          Passed - eGFR  in normal range and within 360 days    GFR calc Af Amer  Date Value Ref Range Status  03/09/2018 99 >59 mL/min/1.73 Final   GFR calc non Af Amer  Date Value Ref Range Status  03/09/2018 85 >59 mL/min/1.73 Final         Passed - Valid encounter within last 6 months    Recent Outpatient Visits          3 months ago Type 2 diabetes mellitus without complication, without long-term current use of insulin [facility])   Primary [facility] at [place] Ranell Patrick, MD   4 months ago Acute bilateral low back pain without sciatica   [facility] at [facility], Ranell Patrick, MD   1 year ago Closed fracture of multiple ribs of right side with routine healing, subsequent encounter   Primary Care at [facility], Ines Bloomer, MD   1 year ago Acute pain of right shoulder   Primary Care at [facility], Ines Bloomer, MD   1 year ago Acute bacterial conjunctivitis of both eyes   Primary [facility] at [facility], Staples D, [place]

## 2019-05-02 ENCOUNTER — Ambulatory Visit: Payer: PRIVATE HEALTH INSURANCE | Attending: Internal Medicine

## 2019-05-02 DIAGNOSIS — Z23 Encounter for immunization: Secondary | ICD-10-CM

## 2019-05-02 NOTE — Progress Notes (Signed)
   Covid-19 Vaccination Clinic  Name:  Chad Jacobs    MRN: [id number] DOB: Jun 07, 1965  05/02/2019  Chad Jacobs was observed post Covid-19 immunization for 15 minutes without incident. He was provided with Vaccine Information Sheet and instruction to access the V-Safe system.   Chad Jacobs was instructed to call 911 with any severe reactions post vaccine: Marland Kitchen Difficulty breathing  . Swelling of face and throat  . A fast heartbeat  . A bad rash all over body  . Dizziness and weakness   Immunizations Administered    Name Date Dose VIS Date Route   Pfizer COVID-19 Vaccine 05/02/2019  8:25 AM 0.3 mL 01/04/2019 Intramuscular   Manufacturer: [employer], [employer]   Lot: [id number]   NDC: [address]-4

## 2019-05-27 ENCOUNTER — Ambulatory Visit: Payer: PRIVATE HEALTH INSURANCE | Attending: Internal Medicine

## 2019-05-27 DIAGNOSIS — Z23 Encounter for immunization: Secondary | ICD-10-CM

## 2019-05-27 NOTE — Progress Notes (Signed)
   Covid-19 Vaccination Clinic  Name:  Chad Jacobs    MRN: [id number] DOB: 1965/01/29  05/27/2019  [name] was observed post Covid-19 immunization for 15 minutes without incident. He was provided with Vaccine Information Sheet and instruction to access the V-Safe system.   [name] was instructed to call 911 with any severe reactions post vaccine: Marland Kitchen Difficulty breathing  . Swelling of face and throat  . A fast heartbeat  . A bad rash all over body  . Dizziness and weakness   Immunizations Administered    Name Date Dose VIS Date Route   Pfizer COVID-19 Vaccine 05/27/2019  8:18 AM 0.3 mL 03/20/2018 Intramuscular   Manufacturer: [employer], [employer]   Lot: [id number]   NDC: [address]-8

## 2019-06-10 ENCOUNTER — Telehealth: Payer: Self-pay

## 2019-06-10 ENCOUNTER — Other Ambulatory Visit: Payer: Self-pay | Admitting: Family Medicine

## 2019-06-10 DIAGNOSIS — E119 Type 2 diabetes mellitus without complications: Secondary | ICD-10-CM

## 2019-06-10 DIAGNOSIS — I10 Essential (primary) hypertension: Secondary | ICD-10-CM

## 2019-06-10 NOTE — Telephone Encounter (Signed)
Called patient back to request clarification for call , no answer. Pt will need an appt if calls back not seenin over a year for her chronic medical conditions.

## 2019-06-10 NOTE — Telephone Encounter (Signed)
Pt called and stated he was returning a call from our office. Please advise.

## 2019-06-10 NOTE — Telephone Encounter (Signed)
Refill requests for Metformin, and Lisinopril-HCTZ; last refills 05/06/2008; last visit 03/09/2018; no upcoming visits noted; attempted to contact pt; left message on voicemail; will route to office for final disposition. Requested medication (s) are due for refill today: Metformin, yes  Requested medication (s) are on the active medication list:yes  Last refill:  05/07/2018  Future visit scheduled: no  Notes to clinic:  Attempted to contact pt; left message on voicemail.  Requested medication (s) are due for refill today: Lisinopril-HCTZ, yes  Requested medication (s) are on the active medication list: yes  Last refill:  05/07/2018  Future visit scheduled: no  Notes to clinic: Attempted to contact pt; left message on voicemail.

## 2019-06-20 ENCOUNTER — Encounter: Payer: Self-pay | Admitting: Family Medicine

## 2019-06-20 ENCOUNTER — Ambulatory Visit (INDEPENDENT_AMBULATORY_CARE_PROVIDER_SITE_OTHER): Payer: Managed Care, Other (non HMO) | Admitting: Family Medicine

## 2019-06-20 ENCOUNTER — Other Ambulatory Visit: Payer: Self-pay

## 2019-06-20 VITALS — BP 131/88 | HR 60 | Temp 98.7°F | Ht 69.0 in | Wt 180.0 lb

## 2019-06-20 DIAGNOSIS — Z1211 Encounter for screening for malignant neoplasm of colon: Secondary | ICD-10-CM | POA: Diagnosis not present

## 2019-06-20 DIAGNOSIS — E119 Type 2 diabetes mellitus without complications: Secondary | ICD-10-CM

## 2019-06-20 DIAGNOSIS — E1165 Type 2 diabetes mellitus with hyperglycemia: Secondary | ICD-10-CM | POA: Diagnosis not present

## 2019-06-20 DIAGNOSIS — I10 Essential (primary) hypertension: Secondary | ICD-10-CM

## 2019-06-20 DIAGNOSIS — Z114 Encounter for screening for human immunodeficiency virus [HIV]: Secondary | ICD-10-CM

## 2019-06-20 DIAGNOSIS — B360 Pityriasis versicolor: Secondary | ICD-10-CM

## 2019-06-20 MED ORDER — LISINOPRIL-HYDROCHLOROTHIAZIDE 20-25 MG PO TABS: 1.0000 | ORAL_TABLET | Freq: Every day | ORAL | 1 refills | Status: DC

## 2019-06-20 MED ORDER — KETOCONAZOLE 200 MG PO TABS: 400.0000 mg | ORAL_TABLET | Freq: Once | ORAL | 0 refills | Status: AC

## 2019-06-20 MED ORDER — METFORMIN HCL 500 MG PO TABS: ORAL_TABLET | ORAL | 1 refills | Status: DC

## 2019-06-20 NOTE — Patient Instructions (Addendum)
Ketoconazole 2 pills once for rash.  See information below on tinea versicolor.  I will refer you to see the eye doctor as well as for colonoscopy.  Continue same medications for now, until results of blood work.  Recheck in 6 months if diabetes is controlled.  Follow-up sooner if rash not improving or other concerns.  Thank you for coming in today.   Tinea Versicolor  Tinea versicolor is a skin infection. It is caused by a type of yeast. It is normal for some yeast to be on your skin, but too much yeast causes this infection. The infection causes a rash of light or dark patches on your skin. The rash is most common on the chest, back, neck, or upper arms. The infection usually does not cause other problems. If it is treated, it will probably go away in a few weeks. The infection cannot be spread from one person to another (is not contagious). Follow these instructions at home:  Use over-the-counter and prescription medicines only as told by your doctor.  Scrub your skin every day with dandruff shampoo as told by your doctor.  Do not scratch your skin in the rash area.  Avoid places that are hot and humid.  Do not use tanning booths.  Try to avoid sweating a lot. Contact a doctor if:  Your symptoms get worse.  You have a fever.  You have redness, swelling, or pain in the rash area.  You have fluid or blood coming from your rash.  Your rash feels warm to the touch.  You have pus or a bad smell coming from your rash.  Your rash comes back (recurs) after treatment. Summary  Tinea versicolor is a skin infection. It causes a rash of light or dark patches on your skin.  The rash is most common on the chest, back, neck, or upper arms. This infection usually does not cause other problems.  Use over-the-counter and prescription medicines only as told by your doctor.  If the infection is treated, it will probably go away in a few weeks. This information is not intended to  replace advice given to you by your health care provider. Make sure you discuss any questions you have with your health care provider. Document Revised: 12/23/2016 Document Reviewed: 09/12/2016 Elsevier Patient Education  [place].   Type 2 Diabetes Mellitus, Self Care, Adult Caring for yourself after you have been diagnosed with type 2 diabetes (type 2 diabetes mellitus) means keeping your blood sugar (glucose) under control with a balance of:  Nutrition.  Exercise.  Lifestyle changes.  Medicines or insulin, if necessary.  Support from your team of health care providers and others. The following information explains what you need to know to manage your diabetes at home. What are the risks? Having diabetes can put you at risk for other long-term (chronic) conditions, such as heart disease and kidney disease. Your health care provider may prescribe medicines to help prevent complications from diabetes. These medicines may include:  Aspirin.  Medicine to lower cholesterol.  Medicine to control blood pressure. How to monitor blood glucose   Check your blood glucose every day, as often as told by your health care provider.  Have your A1c (hemoglobin A1c) level checked two or more times a year, or as often as told by your health care provider. Your health care provider will set individualized treatment goals for you. Generally, the goal of treatment is to maintain the following blood glucose levels:  Before  meals (preprandial): 80-130 mg/dL (4.4-7.2 mmol/L).  After meals (postprandial): below 180 mg/dL (10 mmol/L).  A1c level: less than 7%. How to manage hyperglycemia and hypoglycemia Hyperglycemia symptoms Hyperglycemia, also called high blood glucose, occurs when blood glucose is too high. Make sure you know the early signs of hyperglycemia, such as:  Increased thirst.  Hunger.  Feeling very tired.  Needing to urinate more often than usual.  Blurry  vision. Hypoglycemia symptoms Hypoglycemia, also called low blood glucose, occurswith a blood glucose level at or below 70 mg/dL (3.9 mmol/L). The risk for hypoglycemia increases during or after exercise, during sleep, during illness, and when skipping meals or not eating for a long time (fasting). It is important to know the symptoms of hypoglycemia and treat it right away. Always have a 15-gram rapid-acting carbohydrate snack with you to treat low blood glucose. Family members and close friends should also know the symptoms and should understand how to treat hypoglycemia, in case you are not able to treat yourself. Symptoms may include:  Hunger.  Anxiety.  Sweating and feeling clammy.  Confusion.  Dizziness or feeling light-headed.  Sleepiness.  Nausea.  Increased heart rate.  Headache.  Blurry vision.  Irritability.  A change in coordination.  Tingling or numbness around the mouth, lips, or tongue.  Restless sleep.  Fainting.  Seizure. Treating hypoglycemia If you are alert and able to swallow safely, follow the 15:15 rule:  Take 15 grams of a rapid-acting carbohydrate. Talk with your health care provider about how much you should take.  Rapid-acting options include: ? Glucose pills (take 15 grams). ? 6-8 pieces of hard candy. ? 4-6 oz (120-150 mL) of fruit juice. ? 4-6 oz (120-150 mL) of regular (not diet) soda. ? 1 Tbsp (15 mL) honey or sugar.  Check your blood glucose 15 minutes after you take the carbohydrate.  If the repeat blood glucose level is still at or below 70 mg/dL (3.9 mmol/L), take 15 grams of a carbohydrate again.  If your blood glucose level does not increase above 70 mg/dL (3.9 mmol/L) after 3 tries, seek emergency medical care.  After your blood glucose level returns to normal, eat a meal or a snack within 1 hour. Treating severe hypoglycemia Severe hypoglycemia is when your blood glucose level is at or below 54 mg/dL (3 mmol/L). Severe  hypoglycemia is an emergency. Do not wait to see if the symptoms will go away. Get medical help right away. Call your local emergency services (911 in the [place] If you have severe hypoglycemia and you cannot eat or drink, you may need an injection of glucagon. A family member or close friend should learn how to check your blood glucose and how to give you a glucagon injection. Ask your health care provider if you need to have an emergency glucagon injection kit available. Severe hypoglycemia may need to be treated in a hospital. The treatment may include getting glucose through an IV. You may also need treatment for the cause of your hypoglycemia. Follow these instructions at home: Take diabetes medicines as told  If your health care provider prescribed insulin or diabetes medicines, take them every day.  Do not run out of insulin or other diabetes medicines that you take. Plan ahead so you always have these available.  If you use insulin, adjust your dosage based on how physically active you are and what foods you eat. Your health care provider will tell you how to adjust your dosage. Make healthy food choices  The things that you eat and drink affect your blood glucose and your insulin dosage. Making good choices helps to control your diabetes and prevent other health problems. A healthy meal plan includes eating lean proteins, complex carbohydrates, fresh fruits and vegetables, low-fat dairy products, and healthy fats. Make an appointment to see a diet and nutrition specialist (registered dietitian) to help you create an eating plan that is right for you. Make sure that you:  Follow instructions from your health care provider about eating or drinking restrictions.  Drink enough fluid to keep your urine pale yellow.  Keep a record of the carbohydrates that you eat. Do this by reading food labels and learning the standard serving sizes of foods.  Follow your sick day plan whenever you cannot  eat or drink as usual. Make this plan in advance with your health care provider.  Stay active Exercise regularly, as told by your health care provider. This may include:  Stretching and doing strength exercises, such as yoga or weightlifting, 2 or more times a week.  Doing 150 minutes or more of moderate-intensity or vigorous-intensity exercise each week. This could be brisk walking, biking, or water aerobics. ? Spread out your activity over 3 or more days of the week. ? Do not go more than 2 days in a row without doing some kind of physical activity. When you start a new exercise or activity, work with your health care provider to adjust your insulin, medicines, or food intake as needed. Make healthy lifestyle choices  Do not use any tobacco products, such as cigarettes, chewing tobacco, and e-cigarettes. If you need help quitting, ask your health care provider.  If your health care provider says that alcohol is safe for you, limit alcohol intake to no more than 1 drink per day for nonpregnant women and 2 drinks per day for men. One drink equals 12 oz of beer (355 mL), 5 oz of wine (148 mL), or 1 oz of hard liquor (44 mL).  Learn to manage stress. If you need help with this, ask your health care provider. Care for your body   Keep your immunizations up to date. In addition to getting vaccinations as told by your health care provider, it is recommended that you get vaccinated against the following illnesses: ? The flu (influenza). Get a flu shot every year. ? Pneumonia. ? Hepatitis B.  Schedule an eye exam soon after your diagnosis, and then one time every year after that.  Check your skin and feet every day for cuts, bruises, redness, blisters, or sores. Schedule a foot exam with your health care provider once every year.  Brush your teeth and gums two times a day, and floss one or more times a day. Visit your dentist one or more times every 6 months.  Maintain a healthy  weight. General instructions  Take over-the-counter and prescription medicines only as told by your health care provider.  Share your diabetes management plan with people in your workplace, school, and household.  Carry a medical alert card or wear medical alert jewelry.  Keep all follow-up visits as told by your health care provider. This is important. Questions to ask your health care provider  Do I need to meet with a diabetes educator?  Where can I find a support group for people with diabetes? Where to find more information For more information about diabetes, visit:  American Diabetes Association (ADA): www.diabetes.org  American Association of Diabetes Educators (AADE): www.diabeteseducator.org Summary  Caring for  yourself after you have been diagnosed with (type 2 diabetes mellitus) means keeping your blood sugar (glucose) under control with a balance of nutrition, exercise, lifestyle changes, and medicine.  Check your blood glucose every day, as often as told by your health care provider.  Having diabetes can put you at risk for other long-term (chronic) conditions, such as heart disease and kidney disease. Your health care provider may prescribe medicines to help prevent complications from diabetes.  Keep all follow-up visits as told by your health care provider. This is important. This information is not intended to replace advice given to you by your health care provider. Make sure you discuss any questions you have with your health care provider. Document Revised: 07/03/2017 Document Reviewed: 02/13/2015 Elsevier Patient Education  [employer].   If you have lab work done today you will be contacted with your lab results within the next 2 weeks.  If you have not heard from [place] then please contact us. The fastest way to get your results is to register for My Chart.   IF you received an x-ray today, you will receive an invoice from [facility]. Please  contact [facility] at [phone] with questions or concerns regarding your invoice.   IF you received labwork today, you will receive an invoice from [facility]. Please contact [facility] at [phone] with questions or concerns regarding your invoice.   Our billing staff will not be able to assist you with questions regarding bills from these companies.  You will be contacted with the lab results as soon as they are available. The fastest way to get your results is to activate your My Chart account. Instructions are located on the last page of this paperwork. If you have not heard from [place] regarding the results in 2 weeks, please contact this office.
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
## 2019-06-21 LAB — COMPREHENSIVE METABOLIC PANEL
ALT: 20 IU/L (ref 0–44)
AST: 19 IU/L (ref 0–40)
Albumin/Globulin Ratio: 1.2 (ref 1.2–2.2)
Albumin: 4.2 g/dL (ref 3.8–4.9)
Alkaline Phosphatase: 117 IU/L (ref 48–121)
BUN/Creatinine Ratio: 15 (ref 9–20)
BUN: 13 mg/dL (ref 6–24)
Bilirubin Total: 0.5 mg/dL (ref 0.0–1.2)
CO2: 23 mmol/L (ref 20–29)
Calcium: 9.2 mg/dL (ref 8.7–10.2)
Chloride: 98 mmol/L (ref 96–106)
Creatinine, Ser: 0.87 mg/dL (ref 0.76–1.27)
GFR calc Af Amer: 112 mL/min/{1.73_m2} (ref >59)
GFR calc non Af Amer: 96 mL/min/{1.73_m2} (ref >59)
Globulin, Total: 3.5 g/dL (ref 1.5–4.5)
Glucose: 176 mg/dL — ABNORMAL HIGH (ref 65–99)
Potassium: 3.9 mmol/L (ref 3.5–5.2)
Sodium: 135 mmol/L (ref 134–144)
Total Protein: 7.7 g/dL (ref 6.0–8.5)

## 2019-06-21 LAB — HIV ANTIBODY (ROUTINE TESTING W REFLEX): HIV Screen 4th Generation wRfx: NONREACTIVE

## 2019-06-21 LAB — LIPID PANEL
Chol/HDL Ratio: 3.8 ratio (ref 0.0–5.0)
Cholesterol, Total: 211 mg/dL — ABNORMAL HIGH (ref 100–199)
HDL: 55 mg/dL (ref >39)
LDL Chol Calc (NIH): 127 mg/dL — ABNORMAL HIGH (ref 0–99)
Triglycerides: 166 mg/dL — ABNORMAL HIGH (ref 0–149)
VLDL Cholesterol Cal: 29 mg/dL (ref 5–40)

## 2019-06-21 LAB — HEMOGLOBIN A1C
Est. average glucose Bld gHb Est-mCnc: 171 mg/dL
Hgb A1c MFr Bld: 7.6 % — ABNORMAL HIGH (ref 4.8–5.6)

## 2019-07-01 ENCOUNTER — Encounter: Payer: Self-pay | Admitting: Gastroenterology

## 2019-07-04 ENCOUNTER — Other Ambulatory Visit: Payer: Self-pay | Admitting: Family Medicine

## 2019-07-04 DIAGNOSIS — E785 Hyperlipidemia, unspecified: Secondary | ICD-10-CM

## 2019-07-04 DIAGNOSIS — E1165 Type 2 diabetes mellitus with hyperglycemia: Secondary | ICD-10-CM

## 2019-07-04 MED ORDER — ATORVASTATIN CALCIUM 10 MG PO TABS: 10.0000 mg | ORAL_TABLET | Freq: Every day | ORAL | 0 refills | Status: DC

## 2019-07-04 NOTE — Progress Notes (Signed)
See labs 

## 2019-07-05 ENCOUNTER — Telehealth: Payer: Self-pay

## 2019-07-05 NOTE — Telephone Encounter (Signed)
I have printed out the most recent labs and mailed it to the pt.

## 2019-07-05 NOTE — Telephone Encounter (Signed)
Pt Called state his what his Lab test to be Mailing to him.

## 2019-07-22 ENCOUNTER — Ambulatory Visit (AMBULATORY_SURGERY_CENTER): Payer: Self-pay | Admitting: *Deleted

## 2019-07-22 ENCOUNTER — Other Ambulatory Visit: Payer: Self-pay

## 2019-07-22 VITALS — Ht 69.0 in | Wt 180.0 lb

## 2019-07-22 DIAGNOSIS — Z1211 Encounter for screening for malignant neoplasm of colon: Secondary | ICD-10-CM

## 2019-07-22 NOTE — Progress Notes (Signed)

## 2019-07-30 ENCOUNTER — Other Ambulatory Visit: Payer: Self-pay

## 2019-07-30 ENCOUNTER — Telehealth: Payer: Self-pay

## 2019-07-30 DIAGNOSIS — E119 Type 2 diabetes mellitus without complications: Secondary | ICD-10-CM

## 2019-07-30 MED ORDER — METFORMIN HCL 500 MG PO TABS: ORAL_TABLET | ORAL | 1 refills | Status: DC

## 2019-07-30 NOTE — Telephone Encounter (Signed)
Patient called and would like to change his pharmacy from [facility] to [facility] ([facility] [address] [facility] All  meds need to go

## 2019-08-05 ENCOUNTER — Encounter: Payer: Self-pay | Admitting: Gastroenterology

## 2019-08-15 ENCOUNTER — Ambulatory Visit (AMBULATORY_SURGERY_CENTER): Payer: Managed Care, Other (non HMO) | Admitting: Gastroenterology

## 2019-08-15 ENCOUNTER — Other Ambulatory Visit: Payer: Self-pay

## 2019-08-15 ENCOUNTER — Encounter: Payer: Self-pay | Admitting: Gastroenterology

## 2019-08-15 VITALS — BP 131/80 | HR 50 | Temp 97.1°F | Resp 13 | Ht 69.0 in | Wt 180.0 lb

## 2019-08-15 DIAGNOSIS — Z1211 Encounter for screening for malignant neoplasm of colon: Secondary | ICD-10-CM | POA: Diagnosis not present

## 2019-08-15 MED ORDER — SODIUM CHLORIDE 0.9 % IV SOLN
500.0000 mL | Freq: Once | INTRAVENOUS | Status: DC
Start: 2019-08-15 — End: 2019-08-15

## 2019-08-15 NOTE — Patient Instructions (Signed)
Handout on diverticulosis given.   YOU HAD AN ENDOSCOPIC PROCEDURE TODAY AT THE [facility]:   Refer to the procedure report that was given to you for any specific questions about what was found during the examination.  If the procedure report does not answer your questions, please call your gastroenterologist to clarify.  If you requested that your care partner not be given the details of your procedure findings, then the procedure report has been included in a sealed envelope for you to review at your convenience later.  YOU SHOULD EXPECT: Some feelings of bloating in the abdomen. Passage of more gas than usual.  Walking can help get rid of the air that was put into your GI tract during the procedure and reduce the bloating. If you had a lower endoscopy (such as a colonoscopy or flexible sigmoidoscopy) you may notice spotting of blood in your stool or on the toilet paper. If you underwent a bowel prep for your procedure, you may not have a normal bowel movement for a few days.  Please Note:  You might notice some irritation and congestion in your nose or some drainage.  This is from the oxygen used during your procedure.  There is no need for concern and it should clear up in a day or so.  SYMPTOMS TO REPORT IMMEDIATELY:   Following lower endoscopy (colonoscopy or flexible sigmoidoscopy):  Excessive amounts of blood in the stool  Significant tenderness or worsening of abdominal pains  Swelling of the abdomen that is new, acute  Fever of 100F or higher   For urgent or emergent issues, a gastroenterologist can be reached at any hour by calling [phone]. Do not use MyChart messaging for urgent concerns.    DIET:  We do recommend a small meal at first, but then you may proceed to your regular diet.  Drink plenty of fluids but you should avoid alcoholic beverages for 24 hours.  ACTIVITY:  You should plan to take it easy for the rest of today and you should NOT DRIVE or use  heavy machinery until tomorrow (because of the sedation medicines used during the test).    FOLLOW UP: Our staff will call the number listed on your records 48-72 hours following your procedure to check on you and address any questions or concerns that you may have regarding the information given to you following your procedure. If we do not reach you, we will leave a message.  We will attempt to reach you two times.  During this call, we will ask if you have developed any symptoms of COVID 19. If you develop any symptoms (ie: fever, flu-like symptoms, shortness of breath, cough etc.) before then, please call [phone].  If you test positive for Covid 19 in the 2 weeks post procedure, please call and report this information to us.    If any biopsies were taken you will be contacted by phone or by letter within the next 1-3 weeks.  Please call us at [phone] if you have not heard about the biopsies in 3 weeks.    SIGNATURES/CONFIDENTIALITY: You and/or your care partner have signed paperwork which will be entered into your electronic medical record.  These signatures attest to the fact that that the information above on your After Visit Summary has been reviewed and is understood.  Full responsibility of the confidentiality of this discharge information lies with you and/or your care-partner. 

## 2019-08-15 NOTE — Progress Notes (Signed)
VS by CW  Pt's states no medical or surgical changes since previsit or office visit.  

## 2019-08-15 NOTE — Progress Notes (Signed)
Pt Drowsy. VSS. To PACU, report to RN. No anesthetic complications noted.  

## 2019-08-15 NOTE — Op Note (Signed)
[facility] Patient Name: Chad Jacobs Procedure Date: 08/15/2019 10:11 AM MRN: [id number] Endoscopist: Sherilyn Cooter L. Myrtie Neither , MD Age: 54 Referring MD:  Date of Birth: Jun 21, 1965 Gender: Male Account #: [id number] Procedure:                Colonoscopy Indications:              Screening for colorectal malignant neoplasm, This                            is the patient's first colonoscopy Medicines:                Monitored Anesthesia Care Procedure:                Pre-Anesthesia Assessment:                           - Prior to the procedure, a History and Physical                            was performed, and patient medications and                            allergies were reviewed. The patient's tolerance of                            previous anesthesia was also reviewed. The risks                            and benefits of the procedure and the sedation                            options and risks were discussed with the patient.                            All questions were answered, and informed consent                            was obtained. Prior Anticoagulants: The patient has                            taken no previous anticoagulant or antiplatelet                            agents. ASA Grade Assessment: II - A patient with                            mild systemic disease. After reviewing the risks                            and benefits, the patient was deemed in                            satisfactory condition to undergo the procedure.  After obtaining informed consent, the colonoscope                            was passed under direct vision. Throughout the                            procedure, the patient's blood pressure, pulse, and                            oxygen saturations were monitored continuously. The                            Colonoscope was introduced through the anus and                            advanced to the the cecum,  identified by                            appendiceal orifice and ileocecal valve. The                            colonoscopy was performed without difficulty. The                            patient tolerated the procedure well. The quality                            of the bowel preparation was good after lavage. The                            ileocecal valve, appendiceal orifice, and rectum                            were photographed. The bowel preparation used was                            Miralax. Scope In: 10:16:17 AM Scope Out: 10:32:35 AM Scope Withdrawal Time: 0 hours 11 minutes 12 seconds  Total Procedure Duration: 0 hours 16 minutes 18 seconds  Findings:                 The perianal and digital rectal examinations were                            normal.                           Many diverticula were found in the entire colon.                           The exam was otherwise without abnormality on                            direct and retroflexion views. Complications:            No immediate complications.  Estimated Blood Loss:     Estimated blood loss: none. Impression:               - Diverticulosis in the entire examined colon.                           - The examination was otherwise normal on direct                            and retroflexion views.                           - No specimens collected. Recommendation:           - Patient has a contact number available for                            emergencies. The signs and symptoms of potential                            delayed complications were discussed with the                            patient. Return to normal activities tomorrow.                            Written discharge instructions were provided to the                            patient.                           - Resume previous diet.                           - Continue present medications.                           - Repeat colonoscopy in 10 years for  screening                            purposes. (Suprep or Plenvu for next exam) Adrienne Trombetta L. Myrtie Neither, MD 08/15/2019 10:42:04 AM This report has been signed electronically.

## 2019-08-19 ENCOUNTER — Telehealth: Payer: Self-pay | Admitting: *Deleted

## 2019-08-19 ENCOUNTER — Telehealth: Payer: Self-pay

## 2019-08-19 NOTE — Telephone Encounter (Signed)
Attempted to reach patient for post-procedure f/u call. No answer. Left message that we will make another attempt to reach him later today and for him to please not hesitate to call us if he has any questions/concerns regarding his care. 

## 2019-08-19 NOTE — Telephone Encounter (Signed)
Second follow-up call attempt.  Message left earlier this AM.

## 2019-08-19 NOTE — Telephone Encounter (Signed)
Second follow up call attempt.  No answer.  Message left previously this am.

## 2019-08-21 LAB — HM DIABETES EYE EXAM

## 2019-12-20 ENCOUNTER — Ambulatory Visit: Payer: Managed Care, Other (non HMO) | Admitting: Family Medicine

## 2019-12-25 ENCOUNTER — Ambulatory Visit: Payer: Managed Care, Other (non HMO) | Admitting: Family Medicine

## 2020-01-08 ENCOUNTER — Ambulatory Visit (INDEPENDENT_AMBULATORY_CARE_PROVIDER_SITE_OTHER): Payer: Self-pay | Admitting: Family Medicine

## 2020-01-08 ENCOUNTER — Other Ambulatory Visit: Payer: Self-pay

## 2020-01-08 ENCOUNTER — Encounter: Payer: Self-pay | Admitting: Family Medicine

## 2020-01-08 VITALS — BP 150/96 | HR 71 | Temp 98.3°F | Ht 69.0 in | Wt 193.0 lb

## 2020-01-08 DIAGNOSIS — E1165 Type 2 diabetes mellitus with hyperglycemia: Secondary | ICD-10-CM

## 2020-01-08 DIAGNOSIS — L02211 Cutaneous abscess of abdominal wall: Secondary | ICD-10-CM

## 2020-01-08 DIAGNOSIS — Z1159 Encounter for screening for other viral diseases: Secondary | ICD-10-CM

## 2020-01-08 DIAGNOSIS — I10 Essential (primary) hypertension: Secondary | ICD-10-CM | POA: Diagnosis not present

## 2020-01-08 DIAGNOSIS — E119 Type 2 diabetes mellitus without complications: Secondary | ICD-10-CM

## 2020-01-08 DIAGNOSIS — E785 Hyperlipidemia, unspecified: Secondary | ICD-10-CM | POA: Diagnosis not present

## 2020-01-08 DIAGNOSIS — Z0001 Encounter for general adult medical examination with abnormal findings: Secondary | ICD-10-CM

## 2020-01-08 DIAGNOSIS — Z23 Encounter for immunization: Secondary | ICD-10-CM

## 2020-01-08 DIAGNOSIS — Z0000 Encounter for general adult medical examination without abnormal findings: Secondary | ICD-10-CM

## 2020-01-08 MED ORDER — ATORVASTATIN CALCIUM 10 MG PO TABS: 10.0000 mg | ORAL_TABLET | Freq: Every day | ORAL | 0 refills | Status: DC

## 2020-01-08 MED ORDER — METFORMIN HCL 500 MG PO TABS: ORAL_TABLET | ORAL | 1 refills | Status: AC

## 2020-01-08 MED ORDER — LISINOPRIL-HYDROCHLOROTHIAZIDE 20-25 MG PO TABS: 1.0000 | ORAL_TABLET | Freq: Every day | ORAL | 1 refills | Status: AC

## 2020-01-08 NOTE — Patient Instructions (Addendum)
Try to avoid added salt to food.  Restart lisinopril HCTZ once per day for blood pressure.  Restart lipitor once per day for cholesterol.  Recheck in 6 weeks  - fasting labs at that time.   Options for dentist.  [facility]  Address: [address] [place], [place] [address]  [email or web address]  Phone: [phone]   [facility]  Address: [address] [place], [place] [address] Phone: [phone]  Keeping You Healthy  Get These Tests  Blood Pressure- Have your blood pressure checked by your healthcare provider at least once a year.  Normal blood pressure is 120/80.  Weight- Have your body mass index (BMI) calculated to screen for obesity.  BMI is a measure of body fat based on height and weight.  You can calculate your own BMI at https://www.west-esparza.com/  Cholesterol- Have your cholesterol checked every year.  Diabetes- Have your blood sugar checked every year if you have high blood pressure, high cholesterol, a family history of diabetes or if you are overweight.  Pap Test - Have a pap test every 1 to 5 years if you have been sexually active.  If you are older than 65 and recent pap tests have been normal you may not need additional pap tests.  In addition, if you have had a hysterectomy  for benign disease additional pap tests are not necessary.  Mammogram-Yearly mammograms are essential for early detection of breast cancer  Screening for Colon Cancer- Colonoscopy starting at age 32. Screening may begin sooner depending on your family history and other health conditions.  Follow up colonoscopy as directed by your Gastroenterologist.  Screening for Osteoporosis- Screening begins at age 82 with bone density scanning, sooner if you are at higher risk for developing Osteoporosis.  Get these medicines  Calcium with Vitamin D- Your body requires 1200-1500 mg of Calcium a day and [phone] IU of Vitamin D a day.  You can only absorb 500 mg of  Calcium at a time therefore Calcium must be taken in 2 or 3 separate doses throughout the day.  Hormones- Hormone therapy has been associated with increased risk for certain cancers and heart disease.  Talk to your healthcare provider about if you need relief from menopausal symptoms.  Aspirin- Ask your healthcare provider about taking Aspirin to prevent Heart Disease and Stroke.  Get these Immuniztions  Flu shot- Every fall  Pneumonia shot- Once after the age of 62; if you are younger ask your healthcare provider if you need a pneumonia shot.  Tetanus- Every ten years.  Zostavax- Once after the age of 93 to prevent shingles.  Take these steps  Don't smoke- Your healthcare provider can help you quit. For tips on how to quit, ask your healthcare provider or go to www.smokefree.gov or call 1-800 QUIT-NOW.  Be physically active- Exercise 5 days a week for a minimum of 30 minutes.  If you are not already physically active, start slow and gradually work up to 30 minutes of moderate physical activity.  Try walking, dancing, bike riding, swimming, etc.  Eat a healthy diet- Eat a variety of healthy foods such as fruits, vegetables, whole grains, low fat milk, low fat cheeses, yogurt, lean meats, chicken, fish, eggs, dried beans, tofu, etc.  For more information go to www.thenutritionsource.org  Dental visit- Brush and floss teeth twice daily; visit your dentist twice a year.  Eye exam- Visit your Optometrist or Ophthalmologist yearly.  Drink alcohol in moderation- Limit alcohol intake to one  drink or less a day.  Never drink and drive.  Depression- Your emotional health is as important as your physical health.  If you're feeling down or losing interest in things you normally enjoy, please talk to your healthcare provider.  Seat Belts- can save your life; always wear one  Smoke/Carbon Monoxide detectors- These detectors need to be installed on the appropriate level of your home.  Replace  batteries at least once a year.  Violence- If anyone is threatening or hurting you, please tell your healthcare provider.  Living Will/ Health care power of attorney- Discuss with your healthcare provider and family.    If you have lab work done today you will be contacted with your lab results within the next 2 weeks.  If you have not heard from [place] then please contact us. The fastest way to get your results is to register for My Chart.   IF you received an x-ray today, you will receive an invoice from [facility]. Please contact [facility] at [phone] with questions or concerns regarding your invoice.   IF you received labwork today, you will receive an invoice from [place] [facility]. Please contact [facility] at [phone] with questions or concerns regarding your invoice.   Our billing staff will not be able to assist you with questions regarding bills from these companies.  You will be contacted with the lab results as soon as they are available. The fastest way to get your results is to activate your My Chart account. Instructions are located on the last page of this paperwork. If you have not heard from [place] regarding the results in 2 weeks, please contact this office.
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
## 2020-01-09 LAB — COMPREHENSIVE METABOLIC PANEL
ALT: 24 IU/L (ref 0–44)
ALT: 25 IU/L (ref 0–44)
AST: 20 IU/L (ref 0–40)
AST: 23 IU/L (ref 0–40)
Albumin/Globulin Ratio: 1.1 — ABNORMAL LOW (ref 1.2–2.2)
Albumin/Globulin Ratio: 1.2 (ref 1.2–2.2)
Albumin: 3.8 g/dL (ref 3.8–4.9)
Albumin: 4 g/dL (ref 3.8–4.9)
Alkaline Phosphatase: 115 IU/L (ref 44–121)
Alkaline Phosphatase: 119 IU/L (ref 44–121)
BUN/Creatinine Ratio: 18 (ref 9–20)
BUN/Creatinine Ratio: 17 (ref 9–20)
BUN: 13 mg/dL (ref 6–24)
BUN: 13 mg/dL (ref 6–24)
Bilirubin Total: 0.4 mg/dL (ref 0.0–1.2)
Bilirubin Total: 0.4 mg/dL (ref 0.0–1.2)
CO2: 22 mmol/L (ref 20–29)
CO2: 22 mmol/L (ref 20–29)
Calcium: 8.7 mg/dL (ref 8.7–10.2)
Calcium: 8.7 mg/dL (ref 8.7–10.2)
Chloride: 104 mmol/L (ref 96–106)
Chloride: 103 mmol/L (ref 96–106)
Creatinine, Ser: 0.73 mg/dL — ABNORMAL LOW (ref 0.76–1.27)
Creatinine, Ser: 0.76 mg/dL (ref 0.76–1.27)
GFR calc Af Amer: 120 mL/min/{1.73_m2} (ref >59)
GFR calc Af Amer: 118 mL/min/{1.73_m2} (ref >59)
GFR calc non Af Amer: 104 mL/min/{1.73_m2} (ref >59)
GFR calc non Af Amer: 102 mL/min/{1.73_m2} (ref >59)
Globulin, Total: 3.5 g/dL (ref 1.5–4.5)
Globulin, Total: 3.3 g/dL (ref 1.5–4.5)
Glucose: 156 mg/dL — ABNORMAL HIGH (ref 65–99)
Glucose: 164 mg/dL — ABNORMAL HIGH (ref 65–99)
Potassium: 4.3 mmol/L (ref 3.5–5.2)
Potassium: 4.2 mmol/L (ref 3.5–5.2)
Sodium: 139 mmol/L (ref 134–144)
Sodium: 137 mmol/L (ref 134–144)
Total Protein: 7.3 g/dL (ref 6.0–8.5)
Total Protein: 7.3 g/dL (ref 6.0–8.5)

## 2020-01-09 LAB — LIPID PANEL
Chol/HDL Ratio: 4.6 ratio (ref 0.0–5.0)
Cholesterol, Total: 215 mg/dL — ABNORMAL HIGH (ref 100–199)
HDL: 47 mg/dL (ref >39)
LDL Chol Calc (NIH): 127 mg/dL — ABNORMAL HIGH (ref 0–99)
Triglycerides: 229 mg/dL — ABNORMAL HIGH (ref 0–149)
VLDL Cholesterol Cal: 41 mg/dL — ABNORMAL HIGH (ref 5–40)

## 2020-01-09 LAB — MICROALBUMIN / CREATININE URINE RATIO
Creatinine, Urine: 149.7 mg/dL
Microalb/Creat Ratio: 2 mg/g creat (ref 0–29)
Microalbumin, Urine: 3.6 ug/mL

## 2020-01-09 LAB — HEMOGLOBIN A1C
Est. average glucose Bld gHb Est-mCnc: 180 mg/dL
Hgb A1c MFr Bld: 7.9 % — ABNORMAL HIGH (ref 4.8–5.6)

## 2020-01-09 LAB — HEPATITIS C ANTIBODY: Hep C Virus Ab: <0.1 s/co ratio (ref 0.0–0.9)

## 2020-02-21 ENCOUNTER — Ambulatory Visit: Payer: BC Managed Care – PPO | Admitting: Family Medicine

## 2020-02-21 ENCOUNTER — Encounter: Payer: Self-pay | Admitting: Family Medicine

## 2020-02-21 ENCOUNTER — Other Ambulatory Visit: Payer: Self-pay

## 2020-02-21 VITALS — BP 112/80 | HR 69 | Temp 98.8°F | Ht 69.0 in | Wt 188.2 lb

## 2020-02-21 DIAGNOSIS — E785 Hyperlipidemia, unspecified: Secondary | ICD-10-CM

## 2020-02-21 DIAGNOSIS — I10 Essential (primary) hypertension: Secondary | ICD-10-CM

## 2020-02-21 DIAGNOSIS — E1165 Type 2 diabetes mellitus with hyperglycemia: Secondary | ICD-10-CM | POA: Diagnosis not present

## 2020-02-21 LAB — COMPREHENSIVE METABOLIC PANEL
ALT: 33 IU/L (ref 0–44)
AST: 17 IU/L (ref 0–40)
Albumin/Globulin Ratio: 1.3 (ref 1.2–2.2)
Albumin: 4.3 g/dL (ref 3.8–4.9)
Alkaline Phosphatase: 142 IU/L — ABNORMAL HIGH (ref 44–121)
BUN/Creatinine Ratio: 16 (ref 9–20)
BUN: 15 mg/dL (ref 6–24)
Bilirubin Total: 0.5 mg/dL (ref 0.0–1.2)
CO2: 22 mmol/L (ref 20–29)
Calcium: 9.1 mg/dL (ref 8.7–10.2)
Chloride: 97 mmol/L (ref 96–106)
Creatinine, Ser: 0.93 mg/dL (ref 0.76–1.27)
GFR calc Af Amer: 105 mL/min/{1.73_m2} (ref >59)
GFR calc non Af Amer: 91 mL/min/{1.73_m2} (ref >59)
Globulin, Total: 3.4 g/dL (ref 1.5–4.5)
Glucose: 201 mg/dL — ABNORMAL HIGH (ref 65–99)
Potassium: 4 mmol/L (ref 3.5–5.2)
Sodium: 136 mmol/L (ref 134–144)
Total Protein: 7.7 g/dL (ref 6.0–8.5)

## 2020-02-21 LAB — LIPID PANEL
Chol/HDL Ratio: 3.7 ratio (ref 0.0–5.0)
Cholesterol, Total: 163 mg/dL (ref 100–199)
HDL: 44 mg/dL (ref >39)
LDL Chol Calc (NIH): 94 mg/dL (ref 0–99)
Triglycerides: 141 mg/dL (ref 0–149)
VLDL Cholesterol Cal: 25 mg/dL (ref 5–40)

## 2020-02-21 NOTE — Patient Instructions (Addendum)
Continue same medications, follow-up in 2 months for repeat diabetes test.  Let me know if there are questions and take care.    If you have lab work done today you will be contacted with your lab results within the next 2 weeks.  If you have not heard from [place] then please contact us. The fastest way to get your results is to register for My Chart.   IF you received an x-ray today, you will receive an invoice from [facility]. Please contact [facility] at [phone] with questions or concerns regarding your invoice.   IF you received labwork today, you will receive an invoice from [place]. Please contact [facility] at [phone] with questions or concerns regarding your invoice.   Our billing staff will not be able to assist you with questions regarding bills from these companies.  You will be contacted with the lab results as soon as they are available. The fastest way to get your results is to activate your My Chart account. Instructions are located on the last page of this paperwork. If you have not heard from [place] regarding the results in 2 weeks, please contact this office.
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
## 2020-04-20 ENCOUNTER — Ambulatory Visit: Payer: Self-pay | Admitting: Family Medicine

## 2020-05-18 ENCOUNTER — Ambulatory Visit: Payer: Managed Care, Other (non HMO) | Admitting: Family Medicine

## 2020-06-13 ENCOUNTER — Other Ambulatory Visit: Payer: Self-pay | Admitting: Family Medicine

## 2020-06-13 DIAGNOSIS — E785 Hyperlipidemia, unspecified: Secondary | ICD-10-CM

## 2020-06-13 DIAGNOSIS — E1165 Type 2 diabetes mellitus with hyperglycemia: Secondary | ICD-10-CM

## 2020-06-19 ENCOUNTER — Ambulatory Visit: Payer: Self-pay | Admitting: Family Medicine

## 2020-08-20 ENCOUNTER — Ambulatory Visit: Payer: Self-pay | Admitting: Family Medicine

## 2020-12-23 DIAGNOSIS — H1045 Other chronic allergic conjunctivitis: Secondary | ICD-10-CM | POA: Diagnosis not present

## 2020-12-23 DIAGNOSIS — E119 Type 2 diabetes mellitus without complications: Secondary | ICD-10-CM | POA: Diagnosis not present

## 2020-12-23 DIAGNOSIS — H11153 Pinguecula, bilateral: Secondary | ICD-10-CM | POA: Diagnosis not present

## 2020-12-23 DIAGNOSIS — H2513 Age-related nuclear cataract, bilateral: Secondary | ICD-10-CM | POA: Diagnosis not present

## 2020-12-23 LAB — HM DIABETES EYE EXAM

## 2020-12-24 ENCOUNTER — Encounter: Payer: Self-pay | Admitting: Family Medicine

## 2022-09-05 ENCOUNTER — Telehealth: Payer: Self-pay | Admitting: Family Medicine

## 2022-09-05 NOTE — Telephone Encounter (Signed)
Patient was last seen in 2022 by provider at [facility]- left voicemail to schedule physical or see if patient has established a new PCP

## 2022-09-23 ENCOUNTER — Telehealth: Payer: Self-pay | Admitting: Family Medicine

## 2022-09-23 NOTE — Telephone Encounter (Signed)
I tried to make appt with pt but couldn't get him to understand that just because he has a day off it doesn't guarantee there is an opening for an appt. He said he would call back to schedule.

## 2022-09-23 NOTE — Telephone Encounter (Signed)
[facility] Night - C Client Site [facility] - Night Provider Meredith Staggers- MD Contact Type Call Who Is Calling Patient / Member / Family / Caregiver Caller Name Liborio Craycraft Phone Number [phone] Patient Name Chad Jacobs Patient DOB 1965-05-28 Call Type Message Only Information Provided Reason for Call Request for General Office Information Initial Comment Caller states he would like to establish with Dr. Neva Seat as a patient. Would like to receive the address for the new location. Additional Comment Provided office hours and address. Disp. Time Disposition Final User 09/23/2022 12:56:51 PM General Information Provided Yes Heffner, Jessica Call Closed By: Lyndee Hensen Transaction Date/Time: 09/23/2022 12:48:24 PM (ET)  Will check back with pt, looks like pt is already established with Dr. Neva Seat.

## 2022-09-27 ENCOUNTER — Telehealth: Payer: Self-pay | Admitting: Family Medicine

## 2022-09-27 NOTE — Telephone Encounter (Signed)
[facility] Night - C Client Site [facility] [place] - Night Provider Meredith Staggers- MD Contact Type Call Who Is Calling Patient / Member / Family / Caregiver Caller Name Sham Christlieb Phone Number [phone] Patient Name Chad Jacobs Patient DOB Mar 27, 1965 Call Type Message Only Information Provided Reason for Call Request for General Office Information Initial Comment Caller states he would like to establish with Dr. Neva Seat as a patient. Would like to receive the address for the new location. Additional Comment Provided office hours and address. Disp. Time Disposition Final User 09/23/2022 12:56:51 PM General Information Provided Yes Lyndee Hensen Call Closed By: Lyndee Hensen Transaction Date/Time: 09/23/2022 12:48:24 PM (ET)  Made appt for Oct 3rd, 2024

## 2022-10-27 ENCOUNTER — Ambulatory Visit: Payer: BC Managed Care – PPO | Admitting: Family Medicine

## 2022-12-08 ENCOUNTER — Ambulatory Visit: Payer: BC Managed Care – PPO | Admitting: Family Medicine

## 2022-12-08 ENCOUNTER — Encounter: Payer: Self-pay | Admitting: Family Medicine
